# Patient Record
Sex: Female | Born: 1990 | Hispanic: No | Marital: Single | Smoking: Former smoker
Health system: Southern US, Community
[De-identification: ages and names within clinical notes are randomized; demographics above are authoritative.]

## PROBLEM LIST (undated history)

## (undated) DIAGNOSIS — I10 Essential (primary) hypertension: Secondary | ICD-10-CM

## (undated) HISTORY — PX: NO PAST SURGERIES: SHX2092

## (undated) HISTORY — DX: Essential (primary) hypertension: I10

---

## 2014-07-03 ENCOUNTER — Emergency Department (HOSPITAL_COMMUNITY)
Admission: EM | Admit: 2014-07-03 | Discharge: 2014-07-03 | Disposition: A | Discharge status: Home / Self Care | Attending: Emergency Medicine | Admitting: Emergency Medicine

## 2014-07-03 ENCOUNTER — Encounter (HOSPITAL_COMMUNITY): Payer: Self-pay | Admitting: Emergency Medicine

## 2014-07-03 ENCOUNTER — Emergency Department (HOSPITAL_COMMUNITY)

## 2014-07-03 DIAGNOSIS — Y9389 Activity, other specified: Secondary | ICD-10-CM | POA: Insufficient documentation

## 2014-07-03 DIAGNOSIS — W010XXA Fall on same level from slipping, tripping and stumbling without subsequent striking against object, initial encounter: Secondary | ICD-10-CM | POA: Diagnosis not present

## 2014-07-03 DIAGNOSIS — S99919A Unspecified injury of unspecified ankle, initial encounter: Secondary | ICD-10-CM | POA: Diagnosis present

## 2014-07-03 DIAGNOSIS — S99929A Unspecified injury of unspecified foot, initial encounter: Principal | ICD-10-CM

## 2014-07-03 DIAGNOSIS — F10920 Alcohol use, unspecified with intoxication, uncomplicated: Secondary | ICD-10-CM

## 2014-07-03 DIAGNOSIS — Y9289 Other specified places as the place of occurrence of the external cause: Secondary | ICD-10-CM | POA: Insufficient documentation

## 2014-07-03 DIAGNOSIS — F1010 Alcohol abuse, uncomplicated: Secondary | ICD-10-CM | POA: Diagnosis not present

## 2014-07-03 DIAGNOSIS — S8990XA Unspecified injury of unspecified lower leg, initial encounter: Secondary | ICD-10-CM | POA: Diagnosis not present

## 2014-07-03 DIAGNOSIS — W1809XA Striking against other object with subsequent fall, initial encounter: Secondary | ICD-10-CM | POA: Insufficient documentation

## 2014-07-03 DIAGNOSIS — S8991XA Unspecified injury of right lower leg, initial encounter: Secondary | ICD-10-CM

## 2014-07-03 MED ORDER — IBUPROFEN 800 MG PO TABS
800.0000 mg | ORAL_TABLET | Freq: Once | ORAL | Status: AC
  Administered 2014-07-03: 800 mg via ORAL
  Filled 2014-07-03: qty 1

## 2014-07-03 NOTE — ED Notes (Signed)
Pt verbalizes that she is leaving with ALL belongings she arrived with. PT is being d/c charged with crutches in hand and dc papers and friend driving.

## 2014-07-03 NOTE — Discharge Instructions (Signed)
Take motrin 600 mg every 6 hrs for pain.   Use crutches as needed.   Follow up with your doctor.   Return to ER if you have severe pain, unable to walk.

## 2014-07-03 NOTE — ED Notes (Signed)
Pt states she was at the pool earlier today, drinking, and slipped and fell, hitting her R knee. Now c/o R knee pain. Alert and oriented.

## 2014-07-03 NOTE — ED Provider Notes (Signed)
CSN: [id number]     Arrival date & time 07/03/14  0229 History   First MD Initiated Contact with Patient 07/03/14 [phone]     Chief Complaint  Patient presents with  . Knee Pain     (Consider location/radiation/quality/duration/timing/severity/associated sxs/prior Treatment) The history is provided by the patient.  Brittany Long is a 23 y.o. female here with R knee pain. Was playing at the pool and drinking alcohol and accidentally slipped and fell and hit R knee. Denies other injuries.    History reviewed. No pertinent past medical history. History reviewed. No pertinent past surgical history. History reviewed. No pertinent family history. History  Substance Use Topics  . Smoking status: Not on file  . Smokeless tobacco: Not on file  . Alcohol Use: Yes   OB History   Grav Para Term Preterm Abortions TAB SAB Ect Mult Living                 Review of Systems  Musculoskeletal:       R knee pain   All other systems reviewed and are negative.     Allergies  Review of patient's allergies indicates no known allergies.  Home Medications   Prior to Admission medications   Medication Sig Start Date End Date Taking? Authorizing Provider  acetaminophen (TYLENOL) 500 MG tablet Take 1,000 mg by mouth every 6 (six) hours as needed for mild pain.   Yes Historical Provider, MD   BP 117/81  Pulse 92  Temp(Src) 99.1 F (37.3 C) (Oral)  SpO2 98% Physical Exam  Nursing note and vitals reviewed. Constitutional: She is oriented to person, place, and time.  Slightly intoxicated   HENT:  Head: Normocephalic and atraumatic.  Eyes: Pupils are equal, round, and reactive to light.  Cardiovascular: Normal rate.   Pulmonary/Chest: Effort normal.  Abdominal: Soft.  Musculoskeletal:  R knee not swollen, nl ROM. Minimal tenderness on knee cap. ACL, PCL intact   Neurological: She is alert and oriented to person, place, and time.  Skin: Skin is warm and dry.  Psychiatric: She has a normal  mood and affect. Her behavior is normal. Judgment normal.    ED Course  Procedures (including critical care time) Labs Review Labs Reviewed - No data to display  Imaging Review Dg Knee Complete 4 Views Right  07/03/2014   CLINICAL DATA:  Fall September 15th, patellar pain.  EXAM: RIGHT KNEE - COMPLETE 4+ VIEW  COMPARISON:  None.  FINDINGS: There is no evidence of fracture, dislocation, or joint effusion. There is no evidence of arthropathy or other focal bone abnormality. Soft tissues are unremarkable.  IMPRESSION: Negative.   Electronically Signed   By: Awilda Metro   On: 07/03/2014 03:42     EKG Interpretation None      MDM   Final diagnoses:  None   Brittany Long is a 23 y.o. female here with R knee pain after fall. Xray showed no obvious fracture. Neurovascular intact. Will give crutches. Patient will be d/c home with friend, who is sober.    Richardean Canal, MD 07/03/14 [phone]

## 2014-09-04 DIAGNOSIS — E559 Vitamin D deficiency, unspecified: Secondary | ICD-10-CM | POA: Insufficient documentation

## 2014-09-16 ENCOUNTER — Ambulatory Visit (INDEPENDENT_AMBULATORY_CARE_PROVIDER_SITE_OTHER): Admitting: Physician Assistant

## 2014-09-16 VITALS — BP 120/76 | HR 81 | Temp 98.9°F | Resp 18 | Ht 64.0 in | Wt 145.8 lb

## 2014-09-16 DIAGNOSIS — R300 Dysuria: Secondary | ICD-10-CM

## 2014-09-16 DIAGNOSIS — Z113 Encounter for screening for infections with a predominantly sexual mode of transmission: Secondary | ICD-10-CM

## 2014-09-16 LAB — POCT UA - MICROSCOPIC ONLY
CRYSTALS, UR, HPF, POC: NEGATIVE
EPITHELIAL CELLS, URINE PER MICROSCOPY: NEGATIVE
MUCUS UA: NEGATIVE
Yeast, UA: NEGATIVE

## 2014-09-16 LAB — POCT URINALYSIS DIPSTICK
Bilirubin, UA: NEGATIVE
Glucose, UA: NEGATIVE
Ketones, UA: NEGATIVE
Nitrite, UA: NEGATIVE
PROTEIN UA: 30
SPEC GRAV UA: 1.025
UROBILINOGEN UA: 0.2
pH, UA: 6

## 2014-09-16 MED ORDER — CIPROFLOXACIN HCL 500 MG PO TABS: 500.0000 mg | ORAL_TABLET | Freq: Two times a day (BID) | ORAL | Status: DC

## 2014-09-16 MED ORDER — PHENAZOPYRIDINE HCL 200 MG PO TABS
200.0000 mg | ORAL_TABLET | Freq: Three times a day (TID) | ORAL | Status: DC | PRN
Start: 2014-09-16 — End: 2017-10-06

## 2014-09-16 NOTE — Progress Notes (Signed)
Subjective:    Patient ID: Brittany Long, female    DOB: 11/02/1990, 23 y.o.   MRN: [id number]   PCP: No PCP Per Patient  Chief Complaint  Patient presents with  . Hematuria    Noticed this morning. Very painful to urinate. Noticed blood in her urine.   . Abdominal Pain    x1 day    No Known Allergies  Patient Active Problem List   Diagnosis Date Noted  . Avitaminosis D 09/04/2014    Prior to Admission medications   Medication Sig Start Date End Date Taking? Authorizing Provider  acetaminophen (TYLENOL) 500 MG tablet Take 1,000 mg by mouth every 6 (six) hours as needed for mild pain.   Yes Historical Provider, MD  Norgestimate-Ethinyl Estradiol Triphasic (TRI-SPRINTEC) 0.18/0.215/0.25 MG-35 MCG tablet Take 1 tablet by mouth daily.   Yes Historical Provider, MD  Vitamin D, Ergocalciferol, (DRISDOL) 50000 UNITS CAPS capsule Take 50,000 Units by mouth every 7 (seven) days.   Yes Historical Provider, MD    Medical, Surgical, Family and Social History reviewed and updated.  HPI  This 23 y.o. female presents for evaluation of burning with urination, urgency and frequency that began this morning. She has low pelvic pain, but no back pain. She noticed blood in her urine today, but also notes that she's on her period. No fever, chills, nausea, vomiting or diarrhea. No HA or dizziness. No atypical vaginal discharge.   Sexually active with 1 female partner, new, last weekend. Did not use condoms, though she typically does.   Review of Systems As above.    Objective:   Physical Exam  Constitutional: She is oriented to person, place, and time. Vital signs are normal. She appears well-developed and well-nourished. No distress.  BP 120/76 mmHg  Pulse 81  Temp(Src) 98.9 F (37.2 C) (Oral)  Resp 18  Ht 5\' 4"  (1.626 m)  Wt 145 lb 12.8 oz (66.134 kg)  BMI 25.01 kg/m2  SpO2 98%  LMP 09/10/2014   HENT:  Head: Normocephalic and atraumatic.  Cardiovascular: Normal rate, regular rhythm  and normal heart sounds.   Pulmonary/Chest: Effort normal and breath sounds normal.  Abdominal: Soft. Normal appearance and bowel sounds are normal. She exhibits no distension and no mass. There is no hepatosplenomegaly. There is tenderness ("makes me feel like I need to pee") in the suprapubic area. There is no rigidity, no rebound, no guarding, no CVA tenderness, no tenderness at McBurney's point and negative Murphy's sign. No hernia.  Musculoskeletal: Normal range of motion.       Lumbar back: Normal.  Neurological: She is alert and oriented to person, place, and time.  Skin: Skin is warm and dry. No rash noted. She is not diaphoretic. No pallor.  Psychiatric: She has a normal mood and affect. Her speech is normal and behavior is normal. Judgment normal.      Results for orders placed or performed in visit on 09/16/14  POCT UA - Microscopic Only  Result Value Ref Range   WBC, Ur, HPF, POC tntc    RBC, urine, microscopic tntc    Bacteria, U Microscopic 4+    Mucus, UA neg    Epithelial cells, urine per micros neg    Crystals, Ur, HPF, POC neg    Casts, Ur, LPF, POC renal tubular    Yeast, UA neg   POCT urinalysis dipstick  Result Value Ref Range   Color, UA Tippens    Clarity, UA cloudy    Glucose, UA  neg    Bilirubin, UA neg    Ketones, UA neg    Spec Grav, UA 1.025    Blood, UA large    pH, UA 6.0    Protein, UA 30    Urobilinogen, UA 0.2    Nitrite, UA neg    Leukocytes, UA moderate (2+)        Assessment & Plan:  1. Dysuria Likely UTI. Await UCx. Anticipatory guidance. Supportive care. RTC if symptoms worsen/persist. Recheck urine in 2 weeks to check for resolution of hematuria. - POCT UA - Microscopic Only - POCT urinalysis dipstick - GC/Chlamydia Probe Amp - Urine culture - ciprofloxacin (CIPRO) 500 MG tablet; Take 1 tablet (500 mg total) by mouth 2 (two) times daily.  Dispense: 10 tablet; Refill: 0 - phenazopyridine (PYRIDIUM) 200 MG tablet; Take 1 tablet (200  mg total) by mouth 3 (three) times daily as needed for pain.  Dispense: 10 tablet; Refill: 0  2. Routine screening for STI (sexually transmitted infection) Encouraged consistent condom use. Await labs. - Hepatitis B surface antigen - Hepatitis C antibody - HIV antibody - HSV(herpes simplex vrs) 1+2 ab-IgG - RPR - Hepatitis B surface antibody   Fernande Brashelle S. Ariona Deschene, PA-C Physician Assistant-Certified [facility]

## 2014-09-16 NOTE — Patient Instructions (Signed)

## 2014-09-17 LAB — HIV ANTIBODY (ROUTINE TESTING W REFLEX): HIV 1&2 Ab, 4th Generation: NONREACTIVE

## 2014-09-17 LAB — HSV(HERPES SIMPLEX VRS) I + II AB-IGG
HSV 1 Glycoprotein G Ab, IgG: 3.36 IV — ABNORMAL HIGH
HSV 2 Glycoprotein G Ab, IgG: <0.1 IV

## 2014-09-17 LAB — GC/CHLAMYDIA PROBE AMP
CT Probe RNA: NEGATIVE
GC Probe RNA: NEGATIVE

## 2014-09-17 LAB — HEPATITIS B SURFACE ANTIGEN: Hepatitis B Surface Ag: NEGATIVE

## 2014-09-17 LAB — SYPHILIS: RPR W/REFLEX TO RPR TITER AND TREPONEMAL ANTIBODIES, TRADITIONAL SCREENING AND DIAGNOSIS ALGORITHM

## 2014-09-17 LAB — HEPATITIS C ANTIBODY: HCV Ab: NEGATIVE

## 2014-09-17 LAB — HEPATITIS B SURFACE ANTIBODY, QUANTITATIVE: Hep B S AB Quant (Post): 2.6 m[IU]/mL

## 2014-09-18 LAB — URINE CULTURE: Colony Count: >=100000

## 2014-09-23 MED ORDER — FLUCONAZOLE 150 MG PO TABS: 150.0000 mg | ORAL_TABLET | Freq: Once | ORAL | Status: DC

## 2014-09-23 MED ORDER — SULFAMETHOXAZOLE-TRIMETHOPRIM 800-160 MG PO TABS: 1.0000 | ORAL_TABLET | Freq: Two times a day (BID) | ORAL | Status: AC

## 2014-09-23 NOTE — Addendum Note (Signed)
Addended by: Fernande BrasJEFFERY, Mattew Chriswell S on: 09/23/2014 12:32 PM   Modules accepted: Orders, Medications

## 2014-09-23 NOTE — Addendum Note (Signed)
Addended by: Fernande BrasJEFFERY, Eesha Schmaltz S on: 09/23/2014 07:30 PM   Modules accepted: Orders

## 2014-09-26 ENCOUNTER — Other Ambulatory Visit: Payer: Self-pay | Admitting: Oral Surgery

## 2014-09-26 DIAGNOSIS — K08409 Partial loss of teeth, unspecified cause, unspecified class: Secondary | ICD-10-CM

## 2017-07-28 ENCOUNTER — Emergency Department (HOSPITAL_COMMUNITY)
Admission: EM | Admit: 2017-07-28 | Discharge: 2017-07-28 | Disposition: A | Discharge status: Home / Self Care | Payer: Medicaid Other | Attending: Emergency Medicine | Admitting: Emergency Medicine

## 2017-07-28 ENCOUNTER — Encounter (HOSPITAL_COMMUNITY): Payer: Self-pay | Admitting: *Deleted

## 2017-07-28 DIAGNOSIS — O4690 Antepartum hemorrhage, unspecified, unspecified trimester: Secondary | ICD-10-CM

## 2017-07-28 DIAGNOSIS — B9689 Other specified bacterial agents as the cause of diseases classified elsewhere: Secondary | ICD-10-CM | POA: Diagnosis not present

## 2017-07-28 DIAGNOSIS — O4692 Antepartum hemorrhage, unspecified, second trimester: Secondary | ICD-10-CM | POA: Diagnosis present

## 2017-07-28 DIAGNOSIS — F17290 Nicotine dependence, other tobacco product, uncomplicated: Secondary | ICD-10-CM | POA: Insufficient documentation

## 2017-07-28 DIAGNOSIS — Z2913 Encounter for prophylactic Rho(D) immune globulin: Secondary | ICD-10-CM | POA: Diagnosis not present

## 2017-07-28 DIAGNOSIS — Z3A14 14 weeks gestation of pregnancy: Secondary | ICD-10-CM | POA: Insufficient documentation

## 2017-07-28 DIAGNOSIS — O99332 Smoking (tobacco) complicating pregnancy, second trimester: Secondary | ICD-10-CM | POA: Insufficient documentation

## 2017-07-28 DIAGNOSIS — O23592 Infection of other part of genital tract in pregnancy, second trimester: Secondary | ICD-10-CM | POA: Insufficient documentation

## 2017-07-28 DIAGNOSIS — N898 Other specified noninflammatory disorders of vagina: Secondary | ICD-10-CM | POA: Insufficient documentation

## 2017-07-28 DIAGNOSIS — R1031 Right lower quadrant pain: Secondary | ICD-10-CM | POA: Insufficient documentation

## 2017-07-28 DIAGNOSIS — N760 Acute vaginitis: Secondary | ICD-10-CM

## 2017-07-28 LAB — URINALYSIS, ROUTINE W REFLEX MICROSCOPIC
Bilirubin Urine: NEGATIVE
Glucose, UA: NEGATIVE mg/dL
Ketones, ur: NEGATIVE mg/dL
Leukocytes, UA: NEGATIVE
Nitrite: NEGATIVE
Protein, ur: NEGATIVE mg/dL
Specific Gravity, Urine: 1.015 (ref 1.005–1.030)
pH: 6 (ref 5.0–8.0)

## 2017-07-28 LAB — CBC WITH DIFFERENTIAL/PLATELET
Basophils Absolute: 0 10*3/uL (ref 0.0–0.1)
Basophils Relative: 0 %
Eosinophils Absolute: 0.2 10*3/uL (ref 0.0–0.7)
Eosinophils Relative: 2 %
HCT: 39.1 % (ref 36.0–46.0)
Hemoglobin: 13.8 g/dL (ref 12.0–15.0)
Lymphocytes Relative: 26 %
Lymphs Abs: 2.7 10*3/uL (ref 0.7–4.0)
MCH: 29.7 pg (ref 26.0–34.0)
MCHC: 35.3 g/dL (ref 30.0–36.0)
MCV: 84.1 fL (ref 78.0–100.0)
Monocytes Absolute: 0.7 10*3/uL (ref 0.1–1.0)
Monocytes Relative: 7 %
Neutro Abs: 6.7 10*3/uL (ref 1.7–7.7)
Neutrophils Relative %: 65 %
Platelets: 195 10*3/uL (ref 150–400)
RBC: 4.65 MIL/uL (ref 3.87–5.11)
RDW: 12.1 % (ref 11.5–15.5)
WBC: 10.2 10*3/uL (ref 4.0–10.5)

## 2017-07-28 LAB — BASIC METABOLIC PANEL
Anion gap: 8 (ref 5–15)
BUN: 7 mg/dL (ref 6–20)
CO2: 25 mmol/L (ref 22–32)
Calcium: 9 mg/dL (ref 8.9–10.3)
Chloride: 102 mmol/L (ref 101–111)
Creatinine, Ser: 0.55 mg/dL (ref 0.44–1.00)
GFR calc Af Amer: >60 mL/min (ref >60)
GFR calc non Af Amer: >60 mL/min (ref >60)
Glucose, Bld: 94 mg/dL (ref 65–99)
Potassium: 3.8 mmol/L (ref 3.5–5.1)
Sodium: 135 mmol/L (ref 135–145)

## 2017-07-28 LAB — URINALYSIS, MICROSCOPIC (REFLEX): WBC, UA: NONE SEEN WBC/hpf (ref 0–5)

## 2017-07-28 LAB — WET PREP, GENITAL
Sperm: NONE SEEN
Trich, Wet Prep: NONE SEEN
Yeast Wet Prep HPF POC: NONE SEEN

## 2017-07-28 LAB — ABO/RH
ABO/RH(D): O NEG
Antibody Screen: NEGATIVE

## 2017-07-28 LAB — HCG, QUANTITATIVE, PREGNANCY: hCG, Beta Chain, Quant, S: 63541 m[IU]/mL — ABNORMAL HIGH (ref <5)

## 2017-07-28 MED ORDER — RHO D IMMUNE GLOBULIN 1500 UNIT/2ML IJ SOSY
300.0000 ug | PREFILLED_SYRINGE | Freq: Once | INTRAMUSCULAR | Status: AC
  Administered 2017-07-28: 300 ug via INTRAMUSCULAR

## 2017-07-28 MED ORDER — METRONIDAZOLE 500 MG PO TABS: 500.0000 mg | ORAL_TABLET | Freq: Two times a day (BID) | ORAL | 0 refills | Status: DC

## 2017-07-28 NOTE — ED Notes (Signed)
Pelvic Cart at bedside 

## 2017-07-28 NOTE — ED Provider Notes (Signed)
[facility]EMERGENCY DEPT Provider Note   CSN: [id number] Arrival date & time: 07/28/17  4098     History   Chief Complaint Chief Complaint  Patient presents with  . Vaginal Bleeding    HPI Brittany Long is a 26 y.o. female.  HPI   26 year old female presents today with complaints of vaginal bleeding.  Patient reports she is 14 weeks by LMP of April 21, 2017.  Patient notes that she has been seen by OB/GYN with confirmed intrauterine pregnancy with no concerning findings.  She notes at that time she was having right lower quadrant abdominal pain, they report this is normal in pregnancy.  Patient notes again she had the abdominal pain today, also having small amount of red vaginal bleeding without discharge.  Patient denies any nausea vomiting, infectious etiology.  She reports that she is not sexually active.  No pain currently.     History reviewed. No pertinent past medical history.  Patient Active Problem List   Diagnosis Date Noted  . Avitaminosis D 09/04/2014    History reviewed. No pertinent surgical history.  OB History    Gravida Para Term Preterm AB Living   1             SAB TAB Ectopic Multiple Live Births                   Home Medications    Prior to Admission medications   Medication Sig Start Date End Date Taking? Authorizing Provider  acetaminophen (TYLENOL) 500 MG tablet Take 1,000 mg by mouth every 6 (six) hours as needed for mild pain.    [provider]  fluconazole (DIFLUCAN) 150 MG tablet Take 1 tablet (150 mg total) by mouth once. Repeat if needed 09/23/14   Porfirio Oar, PA-C  metroNIDAZOLE (FLAGYL) 500 MG tablet Take 1 tablet (500 mg total) by mouth 2 (two) times daily. 07/28/17   Makalya Nave, Tinnie Gens, PA-C  Norgestimate-Ethinyl Estradiol Triphasic (TRI-SPRINTEC) 0.18/0.215/0.25 MG-35 MCG tablet Take 1 tablet by mouth daily.    [provider]  phenazopyridine (PYRIDIUM) 200 MG tablet Take 1 tablet (200 mg total) by mouth 3 (three)  times daily as needed for pain. 09/16/14   Porfirio Oar, PA-C  Vitamin D, Ergocalciferol, (DRISDOL) 50000 UNITS CAPS capsule Take 50,000 Units by mouth every 7 (seven) days.    [provider]    Family History No family history on file.  Social History Social History  Substance Use Topics  . Smoking status: Current Every Day Smoker    Years: 1.00    Types: Cigars  . Smokeless tobacco: Never Used     Comment: 1 black & mild daily  . Alcohol use 0.0 oz/week     Comment: every now and then, on the weekend     Allergies   Patient has no known allergies.   Review of Systems Review of Systems  All other systems reviewed and are negative.    Physical Exam Updated Vital Signs BP 120/80 (BP Location: Right Arm)   Pulse 70   Temp 98.6 F (37 C) (Oral)   Resp 16   LMP 04/21/2017   SpO2 100%   Physical Exam  Constitutional: She is oriented to person, place, and time. She appears well-developed and well-nourished.  HENT:  Head: Normocephalic and atraumatic.  Eyes: Pupils are equal, round, and reactive to light. Conjunctivae are normal. Right eye exhibits no discharge. Left eye exhibits no discharge. No scleral icterus.  Neck: Normal range of motion.  No JVD present. No tracheal deviation present.  Pulmonary/Chest: Effort normal. No stridor.  Abdominal: Soft. She exhibits no distension and no mass. There is no tenderness. There is no rebound and no guarding. No hernia.  Genitourinary:  Genitourinary Comments: Small amount of red blood in the vaginal vault, cervical loss is closed, no cervical motion tenderness or masses  Neurological: She is alert and oriented to person, place, and time. Coordination normal.  Skin: Skin is warm.  Psychiatric: She has a normal mood and affect. Her behavior is normal. Judgment and thought content normal.  Nursing note and vitals reviewed.    ED Treatments / Results  Labs (all labs ordered are listed, but only abnormal results  are displayed) Labs Reviewed  WET PREP, GENITAL - Abnormal; Notable for the following:       Result Value   Clue Cells Wet Prep HPF POC PRESENT (*)    WBC, Wet Prep HPF POC MODERATE (*)    All other components within normal limits  HCG, QUANTITATIVE, PREGNANCY - Abnormal; Notable for the following:    hCG, Beta Chain, Quant, S 63,541 (*)    All other components within normal limits  URINALYSIS, ROUTINE W REFLEX MICROSCOPIC - Abnormal; Notable for the following:    Hgb urine dipstick MODERATE (*)    All other components within normal limits  URINALYSIS, MICROSCOPIC (REFLEX) - Abnormal; Notable for the following:    Bacteria, UA RARE (*)    Squamous Epithelial / LPF 6-30 (*)    All other components within normal limits  CBC WITH DIFFERENTIAL/PLATELET  BASIC METABOLIC PANEL  ABO/RH  RH IG WORKUP (INCLUDES ABO/RH)  GC/CHLAMYDIA PROBE AMP ([place]) NOT AT [facility]    EKG  EKG Interpretation None       Radiology No results found.  Procedures Procedures (including critical care time)  Medications Ordered in ED Medications  rho (d) immune globulin (RHIG/RHOPHYLAC) injection 300 mcg (300 mcg Intramuscular Given 07/28/17 1507)     Initial Impression / Assessment and Plan / ED Course  I have reviewed the triage vital signs and the nursing notes.  Pertinent labs & imaging results that were available during my care of the patient were reviewed by me and considered in my medical decision making (see chart for details).     Final Clinical Impressions(s) / ED Diagnoses   Final diagnoses:  Vaginal bleeding in pregnancy  BV (bacterial vaginosis)    Labs: Wet prep, hCG, CBC, BMP, ABO Rh, gonorrhea chlamydia  Imaging:  Consults:  Therapeutics: RhoGam  Discharge Meds:   Assessment/Plan: 26 year old female presents today with vaginal bleeding.  Patient reports normal bleeding at the time of evaluation.  She does have small amount of blood in her vaginal vault.  Her cervix  is closed, no tenderness.  She has reassuring fetal heart sounds at 148 with no abdominal pain.  Patient Brittany Long has confirmed intrauterine pregnancy, very low suspicion for ectopic, significant bleed, or fetal demise given reassuring heart sounds.  Patient does have clue cells on her wet prep, given the fact that she is having vaginal bleeding this could likely be secondary to PVD, she will be treated for metronidazole, encouraged her to follow-up with OB for discussion.  Patient oh negative here, she is a RhoGam candidate given the vaginal bleeding.  Patient will be discharged home with strict return precautions.  Patient verbalized understanding and agreement to today's plan had no further questions or concerns at the time discharge.   New Prescriptions Discharge  Medication List as of 07/28/2017  3:53 PM    START taking these medications   Details  metroNIDAZOLE (FLAGYL) 500 MG tablet Take 1 tablet (500 mg total) by mouth 2 (two) times daily., Starting Thu 07/28/2017, Print         Brittany Long, [place], PA-C 07/28/17 1553    Pricilla Loveless, MD 07/29/17 [phone]

## 2017-07-28 NOTE — ED Triage Notes (Signed)
Pt developed right lower back and lower abdominal pain last night that was it was sharp and now just an ache.  She went to restroom and wiped and saw blood was bright red on the paper. Pt states she is [redacted] weeks pregnant. G-2, P- 0, M-1

## 2017-07-28 NOTE — Discharge Instructions (Signed)
Please read attached information. If you experience any new or worsening signs or symptoms please return to the emergency room for evaluation. Please follow-up with your primary care provider or specialist as discussed. Please use medication prescribed only as directed and discontinue taking if you have any concerning signs or symptoms.   °

## 2017-07-28 NOTE — ED Notes (Addendum)
Doppler at bedside for fetal heart tones. EDP assessing.

## 2017-07-29 LAB — RH IG WORKUP (INCLUDES ABO/RH)
ABO/RH(D): O NEG
ANTIBODY SCREEN: NEGATIVE
Gestational Age(Wks): 14
UNIT DIVISION: 0

## 2017-07-29 LAB — GC/CHLAMYDIA PROBE AMP (~~LOC~~) NOT AT ARMC
Chlamydia: NEGATIVE
Neisseria Gonorrhea: NEGATIVE

## 2017-08-04 ENCOUNTER — Other Ambulatory Visit: Payer: Self-pay | Admitting: Nurse Practitioner

## 2017-08-04 DIAGNOSIS — Z36 Encounter for antenatal screening for chromosomal anomalies: Secondary | ICD-10-CM

## 2017-08-04 LAB — OB RESULTS CONSOLE ANTIBODY SCREEN: Antibody Screen: POSITIVE

## 2017-08-04 LAB — OB RESULTS CONSOLE RPR: RPR: NONREACTIVE

## 2017-08-04 LAB — OB RESULTS CONSOLE PLATELET COUNT: PLATELETS: 187

## 2017-08-04 LAB — GLUCOSE, 3 HOUR

## 2017-08-04 LAB — OB RESULTS CONSOLE VARICELLA ZOSTER ANTIBODY, IGG: VARICELLA IGG: IMMUNE

## 2017-08-04 LAB — CULTURE, OB URINE: Urine Culture, OB: NEGATIVE

## 2017-08-04 LAB — OB RESULTS CONSOLE ABO/RH: RH Type: NEGATIVE

## 2017-08-04 LAB — SICKLE CELL SCREEN: Sickle Cell Screen: NORMAL

## 2017-08-04 LAB — OB RESULTS CONSOLE HGB/HCT, BLOOD
HEMATOCRIT: 43
Hemoglobin: 14.2

## 2017-08-04 LAB — OB RESULTS CONSOLE GC/CHLAMYDIA
CHLAMYDIA, DNA PROBE: NEGATIVE
GC PROBE AMP, GENITAL: NEGATIVE

## 2017-08-04 LAB — OB RESULTS CONSOLE HIV ANTIBODY (ROUTINE TESTING): HIV: NONREACTIVE

## 2017-08-04 LAB — GLUCOSE, 1 HOUR: Glucose, 1 hour: 176

## 2017-08-04 LAB — CYTOLOGY - PAP: PAP SMEAR: NEGATIVE

## 2017-08-04 LAB — OB RESULTS CONSOLE HEPATITIS B SURFACE ANTIGEN: Hepatitis B Surface Ag: NEGATIVE

## 2017-08-04 LAB — OB RESULTS CONSOLE RUBELLA ANTIBODY, IGM: RUBELLA: IMMUNE

## 2017-08-04 LAB — CYSTIC FIBROSIS DIAGNOSTIC STUDY: INTERPRETATION-CFDNA: NEGATIVE

## 2017-08-24 ENCOUNTER — Ambulatory Visit (HOSPITAL_COMMUNITY): Payer: Medicaid Other

## 2017-08-24 ENCOUNTER — Encounter (HOSPITAL_COMMUNITY): Payer: Medicaid Other

## 2017-08-25 ENCOUNTER — Encounter (HOSPITAL_COMMUNITY): Payer: Self-pay | Admitting: Nurse Practitioner

## 2017-08-31 ENCOUNTER — Encounter (HOSPITAL_COMMUNITY): Payer: Self-pay | Admitting: *Deleted

## 2017-09-02 ENCOUNTER — Encounter (HOSPITAL_COMMUNITY): Payer: Self-pay

## 2017-09-02 ENCOUNTER — Ambulatory Visit (HOSPITAL_COMMUNITY)
Admission: RE | Admit: 2017-09-02 | Discharge: 2017-09-02 | Disposition: A | Discharge status: Home / Self Care | Payer: Medicaid Other | Source: Ambulatory Visit | Attending: Nurse Practitioner | Admitting: Nurse Practitioner

## 2017-09-02 ENCOUNTER — Other Ambulatory Visit: Payer: Self-pay | Admitting: Nurse Practitioner

## 2017-09-02 DIAGNOSIS — O352XX0 Maternal care for (suspected) hereditary disease in fetus, not applicable or unspecified: Secondary | ICD-10-CM

## 2017-09-02 DIAGNOSIS — Z3A19 19 weeks gestation of pregnancy: Secondary | ICD-10-CM

## 2017-09-02 DIAGNOSIS — F840 Autistic disorder: Secondary | ICD-10-CM

## 2017-09-02 DIAGNOSIS — Z818 Family history of other mental and behavioral disorders: Secondary | ICD-10-CM | POA: Insufficient documentation

## 2017-09-02 DIAGNOSIS — Z36 Encounter for antenatal screening for chromosomal anomalies: Secondary | ICD-10-CM | POA: Diagnosis present

## 2017-09-02 NOTE — Progress Notes (Signed)
Genetic Counseling  Visit Summary Note  Appointment Date: 09/02/2017 Referred By: Brittany Sorrel, NP  Date of Birth: [birth year]  Pregnancy history: G2P0010 Estimated Date of Delivery: 01/26/18 Estimated Gestational Age: [redacted]w[redacted]d I met with [name] her partner, Brittany Long for genetic counseling because of a family history of autism.  In summary:  Discussed family history of autism  Reviewed multifactorial etiologies for autism  Discussed options of screening / testing  Carrier screening for fragile X syndrome today  Discussed general population carrier screening options - drawn today  CF  SMA  Hemoglobinopathies  We began by reviewing the family history in detail. Ms. BCannadyreported that she has a maternal half brother who has a diagnosis of autism.  He is verbal, has behavioral differences in terms of social skills, eye contact and repetition, and does not read or do math well.  He cannot read a menu or drive a car.  He has no major medical concerns.  Ms. BMccrumbbelieves this is from her brother's father's family and not from their mother's family.  We discussed that fragile X syndrome is a common explanation for autism and intellectual disabilities in males.  While there is no other family history of individuals with differences similar to her brothers in her mother's family, we discussed that it does not rule out fragile X syndrome in her family.  We reviewed the X linked recessive inheritance of fragile X syndrome and the 50% chance for an affected female when women are known carriers.  We discussed the option of carrier screening for fragile X syndrome, including the risks, benefits and limitations of that screening.  We discussed that some female carriers of fragile X syndrome will develop premature ovarian failure and/or fragile X associated tremor and ataxia syndrome.  Thus, there may be implications beyond just those of reproduction.    The family histories were  otherwise found to be noncontributory for birth defects, mental retardation, and known genetic conditions. Without further information regarding the provided family history, an accurate genetic risk cannot be calculated. Further genetic counseling is warranted if more information is obtained.  After reviewing these options, [name] to have screening for Fragile X syndrome.  [name] provided with written information regarding cystic fibrosis (CF), spinal muscular atrophy (SMA) and hemoglobinopathies including the carrier frequency, availability of carrier screening and prenatal diagnosis if indicated.  In addition, we discussed that CF and hemoglobinopathies are routinely screened for as part of the [place] newborn screening panel.  After further discussion, she elected to pursue screening for CF, SMA and hemoglobinopathies also.  [name] exposure to environmental toxins or chemical agents. She denied the use of alcohol, tobacco or street drugs. She denied significant viral illnesses during the course of her pregnancy. Her medical and surgical histories were noncontributory.   I counseled this couple regarding the above risks and available options.  The approximate face-to-face time with the genetic counselor was 35 minutes.  DCam Hai MS Certified Genetic Counselor

## 2017-09-22 ENCOUNTER — Other Ambulatory Visit: Payer: Self-pay

## 2017-10-04 ENCOUNTER — Encounter: Payer: Self-pay | Admitting: *Deleted

## 2017-10-06 ENCOUNTER — Encounter: Payer: Self-pay | Admitting: Obstetrics and Gynecology

## 2017-10-06 ENCOUNTER — Other Ambulatory Visit: Payer: Self-pay

## 2017-10-06 ENCOUNTER — Ambulatory Visit (INDEPENDENT_AMBULATORY_CARE_PROVIDER_SITE_OTHER): Payer: Medicaid Other | Admitting: Obstetrics and Gynecology

## 2017-10-06 VITALS — BP 132/68 | HR 91 | Wt 166.9 lb

## 2017-10-06 DIAGNOSIS — O10912 Unspecified pre-existing hypertension complicating pregnancy, second trimester: Secondary | ICD-10-CM

## 2017-10-06 DIAGNOSIS — O0990 Supervision of high risk pregnancy, unspecified, unspecified trimester: Secondary | ICD-10-CM | POA: Insufficient documentation

## 2017-10-06 DIAGNOSIS — O0992 Supervision of high risk pregnancy, unspecified, second trimester: Secondary | ICD-10-CM

## 2017-10-06 DIAGNOSIS — O10919 Unspecified pre-existing hypertension complicating pregnancy, unspecified trimester: Secondary | ICD-10-CM | POA: Insufficient documentation

## 2017-10-06 LAB — POCT URINALYSIS DIP (DEVICE)
Bilirubin Urine: NEGATIVE
GLUCOSE, UA: NEGATIVE mg/dL
Hgb urine dipstick: NEGATIVE
Leukocytes, UA: NEGATIVE
Nitrite: NEGATIVE
PH: 7 (ref 5.0–8.0)
PROTEIN: NEGATIVE mg/dL
Specific Gravity, Urine: 1.02 (ref 1.005–1.030)
UROBILINOGEN UA: 0.2 mg/dL (ref 0.0–1.0)

## 2017-10-06 LAB — GLUCOSE, 3 HOUR: Glucose 3 Hour: 91

## 2017-10-06 MED ORDER — COMFORT FIT MATERNITY SUPP MED MISC: 0 refills | Status: DC

## 2017-10-06 MED ORDER — ASPIRIN EC 81 MG PO TBEC: 81.0000 mg | DELAYED_RELEASE_TABLET | Freq: Every day | ORAL | 2 refills | Status: DC

## 2017-10-06 NOTE — BH Specialist Note (Unsigned)
Integrated Behavioral Health Initial Visit  MRN: [id number] Name: Brittany Long  Number of Integrated Behavioral Health Clinician visits:: 1/6 Session Start time: ***  Session End time: *** Total time: {IBH Total Time:21014050}  Type of Service: Integrated Behavioral Health- Individual/Family Interpretor:No. Interpretor Name and Language: n/a   Warm Hand Off Completed.       SUBJECTIVE: Brittany Long is a 26 y.o. female accompanied by {CHL AMB ACCOMPANIED FA:[id number]}[phone]} Patient was referred by Dr Jolayne Pantheronstant for ***. Patient reports the following symptoms/concerns: Pt states her primary concern today is ***  Duration of problem: ***; Severity of problem: {Mild/Moderate/Severe:20260}  OBJECTIVE: Mood: {BHH MOOD:22306} and Affect: {BHH AFFECT:22307} Risk of harm to self or others: {CHL AMB BH Suicide Current Mental Status:21022748}  LIFE CONTEXT: Family and Social: *** School/Work: *** Self-Care: *** Life Changes: Current pregnancy ***  GOALS ADDRESSED: Patient will: 1. Reduce symptoms of: {IBH Symptoms:21014056} 2. Increase knowledge and/or ability of: {IBH Patient Tools:21014057}  3. Demonstrate ability to: {IBH Goals:21014053}  INTERVENTIONS: Interventions utilized: {IBH Interventions:21014054}  Standardized Assessments completed: GAD-7 and PHQ 9  ASSESSMENT: Patient currently experiencing ***.   Patient may benefit from psychoeducation and brief therapeutic interventions regarding coping with symptoms of ***.  PLAN: 1. Follow up with behavioral health clinician on : *** 2. Behavioral recommendations:  -*** -*** -Read educational materials regarding coping with symptoms of ***  3. Referral(s): {IBH Referrals:21014055} 4. "From scale of 1-10, how likely are you to follow plan?": ***  Brittany Halliday C Basya Casavant, LCSW  ***

## 2017-10-06 NOTE — Progress Notes (Signed)
   PRENATAL VISIT NOTE  Subjective:  Brittany Long is a 26 y.o. G2P0010 at 3529w0d being seen today for ongoing prenatal care. [facility]  She is currently monitored for the following issues for this high-risk pregnancy and has Avitaminosis D; [redacted] weeks gestation of pregnancy; Hereditary disease in family possibly affecting fetus, affecting management of mother, antepartum condition or complication, not applicable or unspecified fetus; Supervision of high-risk pregnancy; and Chronic hypertension during pregnancy, antepartum on their problem list.  Patient reports backache.  Contractions: Not present. Vag. Bleeding: None.  Movement: Present. Denies leaking of fluid.   The following portions of the patient's history were reviewed and updated as appropriate: allergies, current medications, past family history, past medical history, past social history, past surgical history and problem list. Problem list updated.  Objective:   Vitals:   10/06/17 1333  BP: 132/68  Pulse: 91  Weight: 166 lb 14.4 oz (75.7 kg)    Fetal Status: Fetal Heart Rate (bpm): 145   Movement: Present     General:  Alert, oriented and cooperative. Patient is in no acute distress.  Skin: Skin is warm and dry. No rash noted.   Cardiovascular: Normal heart rate noted  Respiratory: Normal respiratory effort, no problems with respiration noted  Abdomen: Soft, gravid, appropriate for gestational age.  Pain/Pressure: Present     Pelvic: Cervical exam deferred        Extremities: Normal range of motion.  Edema: Trace  Mental Status:  Normal mood and affect. Normal behavior. Normal judgment and thought content.   Assessment and Plan:  Pregnancy: G2P0010 at 1729w0d  1. Supervision of high risk pregnancy in second trimester Patient is doing well without complaints Rx maternity support belt provided  2. Chronic hypertension during pregnancy, antepartum BP stable without meds Rx  ASA provided Growth ultrasound ordered  Preterm labor symptoms and general obstetric precautions including but not limited to vaginal bleeding, contractions, leaking of fluid and fetal movement were reviewed in detail with the patient. Please refer to After Visit Summary for other counseling recommendations.  No Follow-up on file.   Catalina AntiguaPeggy Aurie Harroun, MD

## 2017-10-06 NOTE — Progress Notes (Addendum)
OB US scheduled for 11/07/16 @8 :15am. Pt notified. Educated pt. On benefits of breast feeding for mom & baby.

## 2017-10-07 ENCOUNTER — Telehealth: Payer: Self-pay

## 2017-10-07 NOTE — Telephone Encounter (Signed)
[facility] c/o of need of dental service.  I spoke with a Joni Reiningicole who informed me that the pt has already been seen and if pt needed to be seen again to have pt call for appt @ [phone] opt 5.  LM for pt stating [place] from [place] and if pt had any questions to please give the office a call.

## 2017-11-03 ENCOUNTER — Encounter: Payer: Self-pay | Admitting: Family Medicine

## 2017-11-03 ENCOUNTER — Ambulatory Visit (INDEPENDENT_AMBULATORY_CARE_PROVIDER_SITE_OTHER): Payer: Medicaid Other | Admitting: Obstetrics & Gynecology

## 2017-11-03 VITALS — BP 131/60 | HR 68 | Wt 165.7 lb

## 2017-11-03 DIAGNOSIS — O10913 Unspecified pre-existing hypertension complicating pregnancy, third trimester: Secondary | ICD-10-CM

## 2017-11-03 DIAGNOSIS — O10919 Unspecified pre-existing hypertension complicating pregnancy, unspecified trimester: Secondary | ICD-10-CM

## 2017-11-03 DIAGNOSIS — O0993 Supervision of high risk pregnancy, unspecified, third trimester: Secondary | ICD-10-CM

## 2017-11-03 NOTE — Patient Instructions (Signed)
Return to clinic for any scheduled appointments or obstetric concerns, or go to [facility] for evaluation  

## 2017-11-03 NOTE — Progress Notes (Signed)
   PRENATAL VISIT NOTE  Subjective:  Brittany Long is a 27 y.o. G2P0010 at 6756w0d being seen today for ongoing prenatal care.  She is currently monitored for the following issues for this high-risk pregnancy and has Avitaminosis D; Hereditary disease in family possibly affecting fetus, affecting management of mother, antepartum condition or complication, not applicable or unspecified fetus; Supervision of high-risk pregnancy; and Chronic hypertension during pregnancy, antepartum on their problem list.  Patient reports no complaints.  Contractions: Not present. Vag. Bleeding: None.  Movement: Present. Denies leaking of fluid.   The following portions of the patient's history were reviewed and updated as appropriate: allergies, current medications, past family history, past medical history, past social history, past surgical history and problem list. Problem list updated.  Objective:   Vitals:   11/03/17 1648  BP: 131/60  Pulse: 68  Weight: 165 lb 11.2 oz (75.2 kg)    Fetal Status: Fetal Heart Rate (bpm): 132 Fundal Height: 28 cm Movement: Present     General:  Alert, oriented and cooperative. Patient is in no acute distress.  Skin: Skin is warm and dry. No rash noted.   Cardiovascular: Normal heart rate noted  Respiratory: Normal respiratory effort, no problems with respiration noted  Abdomen: Soft, gravid, appropriate for gestational age.  Pain/Pressure: Absent     Pelvic: Cervical exam deferred        Extremities: Normal range of motion.  Edema: Trace  Mental Status:  Normal mood and affect. Normal behavior. Normal judgment and thought content.   Assessment and Plan:  Pregnancy: G2P0010 at 2056w0d  1. Chronic hypertension during pregnancy, antepartum Stable BP  2. Supervision of high risk pregnancy in third trimester Unable to do 2 hr GTT, unable to come until afternoon. Will do 1 hr GTT instead.  Preterm labor symptoms and general obstetric precautions including but not limited  to vaginal bleeding, contractions, leaking of fluid and fetal movement were reviewed in detail with the patient. Please refer to After Visit Summary for other counseling recommendations.  Return in about 2 weeks (around 11/17/2017) for OB Visit [facility](HOB), 3rd trimester labs, 1 hr GTT, TDap.   Jaynie CollinsUgonna Jaymir Struble, MD

## 2017-11-07 ENCOUNTER — Ambulatory Visit (HOSPITAL_COMMUNITY): Payer: Medicaid Other | Attending: Obstetrics and Gynecology

## 2017-11-07 ENCOUNTER — Other Ambulatory Visit: Payer: Self-pay

## 2017-11-17 ENCOUNTER — Encounter: Payer: Self-pay | Admitting: Obstetrics and Gynecology

## 2017-11-17 ENCOUNTER — Ambulatory Visit (INDEPENDENT_AMBULATORY_CARE_PROVIDER_SITE_OTHER): Payer: Medicaid Other | Admitting: Obstetrics and Gynecology

## 2017-11-17 VITALS — BP 121/74 | HR 96 | Wt 164.0 lb

## 2017-11-17 DIAGNOSIS — O0933 Supervision of pregnancy with insufficient antenatal care, third trimester: Secondary | ICD-10-CM | POA: Diagnosis not present

## 2017-11-17 DIAGNOSIS — O10913 Unspecified pre-existing hypertension complicating pregnancy, third trimester: Secondary | ICD-10-CM

## 2017-11-17 DIAGNOSIS — O0993 Supervision of high risk pregnancy, unspecified, third trimester: Secondary | ICD-10-CM | POA: Diagnosis not present

## 2017-11-17 DIAGNOSIS — O99810 Abnormal glucose complicating pregnancy: Secondary | ICD-10-CM | POA: Diagnosis not present

## 2017-11-17 DIAGNOSIS — O26899 Other specified pregnancy related conditions, unspecified trimester: Secondary | ICD-10-CM

## 2017-11-17 DIAGNOSIS — O10919 Unspecified pre-existing hypertension complicating pregnancy, unspecified trimester: Secondary | ICD-10-CM

## 2017-11-17 DIAGNOSIS — Z6791 Unspecified blood type, Rh negative: Secondary | ICD-10-CM | POA: Insufficient documentation

## 2017-11-17 MED ORDER — TETANUS-DIPHTH-ACELL PERTUSSIS 5-2.5-18.5 LF-MCG/0.5 IM SUSP
0.5000 mL | Freq: Once | INTRAMUSCULAR | Status: AC
  Administered 2017-11-17: 0.5 mL via INTRAMUSCULAR

## 2017-11-17 MED ORDER — RHO D IMMUNE GLOBULIN 1500 UNIT/2ML IJ SOSY
300.0000 ug | PREFILLED_SYRINGE | Freq: Once | INTRAMUSCULAR | Status: AC
  Administered 2017-11-17: 300 ug via INTRAMUSCULAR

## 2017-11-17 NOTE — Progress Notes (Signed)
Prenatal Visit Note Date: 11/17/2017 Clinic: [facility]  Subjective:  Brittany Long is a 27 y.o. G2P0010 at 5690w0d being seen today for ongoing prenatal care.  She is currently monitored for the following issues for this high-risk pregnancy and has Avitaminosis D; Family history of autism; Supervision of high-risk pregnancy; Chronic hypertension during pregnancy, antepartum; Insufficient prenatal care; Rh negative state in antepartum period; and Abnormal glucose affecting pregnancy on their problem list.  Patient reports no complaints.   Contractions: Not present. Vag. Bleeding: None.  Movement: Present. Denies leaking of fluid.   The following portions of the patient's history were reviewed and updated as appropriate: allergies, current medications, past family history, past medical history, past social history, past surgical history and problem list. Problem list updated.  Objective:   Vitals:   11/17/17 1519  BP: 121/74  Pulse: 96  Weight: 164 lb (74.4 kg)    Fetal Status: Fetal Heart Rate (bpm): 143 Fundal Height: 31 cm Movement: Present     General:  Alert, oriented and cooperative. Patient is in no acute distress.  Skin: Skin is warm and dry. No rash noted.   Cardiovascular: Normal heart rate noted  Respiratory: Normal respiratory effort, no problems with respiration noted  Abdomen: Soft, gravid, appropriate for gestational age. Pain/Pressure: Present     Pelvic:  Cervical exam deferred        Extremities: Normal range of motion.  Edema: Trace  Mental Status: Normal mood and affect. Normal behavior. Normal judgment and thought content.   Urinalysis:      Assessment and Plan:  Pregnancy: G2P0010 at 5890w0d  1. Chronic hypertension during pregnancy, antepartum Didn't know about 1/21 growth u/s. Will r/s. D/w her re: starting ap testing at 32wks - US MFM OB FOLLOW UP; Future  2. Supervision of high risk pregnancy in third trimester Pt to come back for  28wk labs. 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 Pt told to please make lab only visit for this before nv rob - CBC; Future - HIV antibody (with reflex); Future - RPR; Future - Glucose Tolerance, 2 Hours w/1 Hour; Future - US MFM OB FOLLOW UP; Future  3.Rh negative state in antepartum period Rhogam today  4. Abnormal glucose affecting pregnancy See above  Preterm labor symptoms and general obstetric precautions including but not limited to vaginal bleeding, contractions, leaking of fluid and fetal movement were reviewed in detail with the patient. Please refer to After Visit Summary for other counseling recommendations.  Return in about 2 weeks (around 12/01/2017) for hrob, bpp, nst.   [place] BingPickens, Tatsuya Okray, MD

## 2017-11-21 ENCOUNTER — Other Ambulatory Visit: Payer: Self-pay | Admitting: Obstetrics and Gynecology

## 2017-11-21 ENCOUNTER — Ambulatory Visit (HOSPITAL_COMMUNITY)
Admission: RE | Admit: 2017-11-21 | Discharge: 2017-11-21 | Disposition: A | Discharge status: Home / Self Care | Payer: Medicaid Other | Source: Ambulatory Visit | Attending: Obstetrics and Gynecology | Admitting: Obstetrics and Gynecology

## 2017-11-21 DIAGNOSIS — O0993 Supervision of high risk pregnancy, unspecified, third trimester: Secondary | ICD-10-CM

## 2017-11-21 DIAGNOSIS — Z362 Encounter for other antenatal screening follow-up: Secondary | ICD-10-CM

## 2017-11-21 DIAGNOSIS — Z3A30 30 weeks gestation of pregnancy: Secondary | ICD-10-CM

## 2017-11-21 DIAGNOSIS — O10919 Unspecified pre-existing hypertension complicating pregnancy, unspecified trimester: Secondary | ICD-10-CM

## 2017-11-30 ENCOUNTER — Ambulatory Visit (HOSPITAL_COMMUNITY): Payer: Medicaid Other

## 2017-11-30 ENCOUNTER — Encounter: Payer: Self-pay | Admitting: General Practice

## 2017-11-30 ENCOUNTER — Ambulatory Visit (INDEPENDENT_AMBULATORY_CARE_PROVIDER_SITE_OTHER): Payer: Medicaid Other | Admitting: Obstetrics and Gynecology

## 2017-11-30 ENCOUNTER — Ambulatory Visit: Payer: Self-pay

## 2017-11-30 ENCOUNTER — Encounter: Payer: Self-pay | Admitting: *Deleted

## 2017-11-30 ENCOUNTER — Other Ambulatory Visit (HOSPITAL_COMMUNITY)
Admission: RE | Admit: 2017-11-30 | Discharge: 2017-11-30 | Disposition: A | Discharge status: Home / Self Care | Payer: Medicaid Other | Source: Ambulatory Visit | Attending: Obstetrics and Gynecology | Admitting: Obstetrics and Gynecology

## 2017-11-30 ENCOUNTER — Other Ambulatory Visit: Payer: Self-pay | Admitting: *Deleted

## 2017-11-30 ENCOUNTER — Ambulatory Visit (INDEPENDENT_AMBULATORY_CARE_PROVIDER_SITE_OTHER): Payer: Medicaid Other | Admitting: *Deleted

## 2017-11-30 VITALS — BP 117/71 | HR 78 | Wt 169.0 lb

## 2017-11-30 DIAGNOSIS — O99810 Abnormal glucose complicating pregnancy: Secondary | ICD-10-CM

## 2017-11-30 DIAGNOSIS — O10919 Unspecified pre-existing hypertension complicating pregnancy, unspecified trimester: Secondary | ICD-10-CM

## 2017-11-30 DIAGNOSIS — O26893 Other specified pregnancy related conditions, third trimester: Secondary | ICD-10-CM | POA: Insufficient documentation

## 2017-11-30 DIAGNOSIS — O10913 Unspecified pre-existing hypertension complicating pregnancy, third trimester: Secondary | ICD-10-CM

## 2017-11-30 DIAGNOSIS — O0993 Supervision of high risk pregnancy, unspecified, third trimester: Secondary | ICD-10-CM

## 2017-11-30 DIAGNOSIS — N898 Other specified noninflammatory disorders of vagina: Secondary | ICD-10-CM | POA: Insufficient documentation

## 2017-11-30 DIAGNOSIS — O0990 Supervision of high risk pregnancy, unspecified, unspecified trimester: Secondary | ICD-10-CM

## 2017-11-30 DIAGNOSIS — O26899 Other specified pregnancy related conditions, unspecified trimester: Secondary | ICD-10-CM

## 2017-11-30 DIAGNOSIS — Z3A31 31 weeks gestation of pregnancy: Secondary | ICD-10-CM | POA: Diagnosis not present

## 2017-11-30 DIAGNOSIS — Z818 Family history of other mental and behavioral disorders: Secondary | ICD-10-CM

## 2017-11-30 DIAGNOSIS — Z6791 Unspecified blood type, Rh negative: Secondary | ICD-10-CM

## 2017-11-30 NOTE — Progress Notes (Signed)
   PRENATAL VISIT NOTE  Subjective:  Brittany Long is a 27 y.o. G2P0010 at 8082w6d being seen today for ongoing prenatal care.  She is currently monitored for the following issues for this high-risk pregnancy and has Avitaminosis D; Family history of autism; Supervision of high-risk pregnancy; Chronic hypertension during pregnancy, antepartum; Rh negative state in antepartum period; and Abnormal glucose affecting pregnancy on their problem list.  Patient reports vomiting and discharge.  Contractions: Not present. Vag. Bleeding: None.  Movement: Present. Denies leaking of fluid.   The following portions of the patient's history were reviewed and updated as appropriate: allergies, current medications, past family history, past medical history, past social history, past surgical history and problem list. Problem list updated.  Objective:   Vitals:   11/30/17 1550  BP: 117/71  Pulse: 78  Weight: 169 lb (76.7 kg)    Fetal Status: Fetal Heart Rate (bpm): NST   Movement: Present     General:  Alert, oriented and cooperative. Patient is in no acute distress.  Skin: Skin is warm and dry. No rash noted.   Cardiovascular: Normal heart rate noted  Respiratory: Normal respiratory effort, no problems with respiration noted  Abdomen: Soft, gravid, appropriate for gestational age.  Pain/Pressure: Present     Pelvic: Cervical exam deferred        Extremities: Normal range of motion.  Edema: Trace  Mental Status:  Normal mood and affect. Normal behavior. Normal judgment and thought content.   Assessment and Plan:  Pregnancy: G2P0010 at 4282w6d  1. 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 Chronic hypertension during pregnancy, antepartum No meds Stable BP BPP today 8/8 NST reactive  3.  Rh negative state in antepartum period Rho gam givem 11/17/17  4. Abnormal glucose affecting pregnancy Has not done 28 week 2 hr GTT  5. Family history of autism counsyl negative  6. Itching Cervicovaginal ancillary sent  Preterm labor symptoms and general obstetric precautions including but not limited to vaginal bleeding, contractions, leaking of fluid and fetal movement were reviewed in detail with the patient. Please refer to After Visit Summary for other counseling recommendations.  Return in about 7 days (around 12/07/2017) for weekly as scheduled, 2 hr GTT.   Conan BowensKelly M Kalid Ghan, MD

## 2017-11-30 NOTE — Progress Notes (Signed)
Pt reports thick white vaginal discharge with some itching. She also reports having recent episodes of sudden vomiting during the night which wake her up out of a deep sleep. Pt has concerns about discoloration of skin on breasts. US for growth done 2/4.

## 2017-11-30 NOTE — Progress Notes (Unsigned)
bpp

## 2017-11-30 NOTE — Progress Notes (Signed)

## 2017-12-01 NOTE — Progress Notes (Signed)
I have reviewed this chart and agree with the RN/CMA assessment and management.    K. Meryl Sheehan Stacey, M.D. Attending Obstetrician & Gynecologist, [facility], [facility]  

## 2017-12-02 ENCOUNTER — Other Ambulatory Visit: Payer: Medicaid Other

## 2017-12-02 ENCOUNTER — Other Ambulatory Visit: Payer: Self-pay | Admitting: *Deleted

## 2017-12-02 DIAGNOSIS — O0993 Supervision of high risk pregnancy, unspecified, third trimester: Secondary | ICD-10-CM

## 2017-12-02 DIAGNOSIS — O0990 Supervision of high risk pregnancy, unspecified, unspecified trimester: Secondary | ICD-10-CM

## 2017-12-02 LAB — CERVICOVAGINAL ANCILLARY ONLY
Bacterial vaginitis: NEGATIVE
CANDIDA VAGINITIS: NEGATIVE
CHLAMYDIA, DNA PROBE: NEGATIVE
Neisseria Gonorrhea: NEGATIVE
Trichomonas: NEGATIVE

## 2017-12-03 LAB — CBC
Hematocrit: 39.6 % (ref 34.0–46.6)
Hemoglobin: 13.2 g/dL (ref 11.1–15.9)
MCH: 28.3 pg (ref 26.6–33.0)
MCHC: 33.3 g/dL (ref 31.5–35.7)
MCV: 85 fL (ref 79–97)
PLATELETS: 218 10*3/uL (ref 150–379)
RBC: 4.66 x10E6/uL (ref 3.77–5.28)
RDW: 14.2 % (ref 12.3–15.4)
WBC: 7.5 10*3/uL (ref 3.4–10.8)

## 2017-12-03 LAB — GLUCOSE TOLERANCE, 2 HOURS W/ 1HR
GLUCOSE, 1 HOUR: 163 mg/dL (ref 65–179)
GLUCOSE, 2 HOUR: 147 mg/dL (ref 65–152)
Glucose, Fasting: 69 mg/dL (ref 65–91)

## 2017-12-03 LAB — RPR: RPR Ser Ql: NONREACTIVE

## 2017-12-03 LAB — HIV ANTIBODY (ROUTINE TESTING W REFLEX): HIV SCREEN 4TH GENERATION: NONREACTIVE

## 2017-12-07 ENCOUNTER — Ambulatory Visit (INDEPENDENT_AMBULATORY_CARE_PROVIDER_SITE_OTHER): Payer: Medicaid Other | Admitting: *Deleted

## 2017-12-07 ENCOUNTER — Ambulatory Visit (INDEPENDENT_AMBULATORY_CARE_PROVIDER_SITE_OTHER): Payer: Medicaid Other | Admitting: Obstetrics and Gynecology

## 2017-12-07 ENCOUNTER — Ambulatory Visit: Payer: Self-pay

## 2017-12-07 VITALS — BP 116/59 | HR 79 | Wt 172.0 lb

## 2017-12-07 DIAGNOSIS — O10913 Unspecified pre-existing hypertension complicating pregnancy, third trimester: Secondary | ICD-10-CM

## 2017-12-07 DIAGNOSIS — O10919 Unspecified pre-existing hypertension complicating pregnancy, unspecified trimester: Secondary | ICD-10-CM

## 2017-12-07 DIAGNOSIS — O0993 Supervision of high risk pregnancy, unspecified, third trimester: Secondary | ICD-10-CM

## 2017-12-07 NOTE — Progress Notes (Signed)
Prenatal Visit Note Date: 12/07/2017 Clinic: [facility]  Subjective:  Brittany Long is a 27 y.o. G2P0010 at 7384w6d being seen today for ongoing prenatal care.  She is currently monitored for the following issues for this high-risk pregnancy and has Avitaminosis D; Family history of autism; Supervision of high-risk pregnancy; Chronic hypertension during pregnancy, antepartum; Rh negative state in antepartum period; and Abnormal glucose affecting pregnancy on their problem list.  Patient reports no complaints.   Contractions: Not present. Vag. Bleeding: None.  Movement: Present. Denies leaking of fluid.   The following portions of the patient's history were reviewed and updated as appropriate: allergies, current medications, past family history, past medical history, past social history, past surgical history and problem list. Problem list updated.  Objective:   Vitals:   12/07/17 1550  BP: (!) 116/59  Pulse: 79  Weight: 172 lb (78 kg)    Fetal Status: Fetal Heart Rate (bpm): NST   Movement: Present     General:  Alert, oriented and cooperative. Patient is in no acute distress.  Skin: Skin is warm and dry. No rash noted.   Cardiovascular: Normal heart rate noted  Respiratory: Normal respiratory effort, no problems with respiration noted  Abdomen: Soft, gravid, appropriate for gestational age. Pain/Pressure: Present     Pelvic:  Cervical exam deferred        Extremities: Normal range of motion.  Edema: Trace  Mental Status: Normal mood and affect. Normal behavior. Normal judgment and thought content.   Urinalysis:      Assessment and Plan:  Pregnancy: G2P0010 at 5084w6d  1. Chronic hypertension during pregnancy, antepartum Doing well on no meds. bpp 10/10 - US MFM OB FOLLOW UP; Future - US MFM FETAL BPP WO NON STRESS; Future  2. Supervision of high risk pregnancy in third trimester  Preterm labor symptoms and general obstetric precautions including but not  limited to vaginal bleeding, contractions, leaking of fluid and fetal movement were reviewed in detail with the patient. Please refer to After Visit Summary for other counseling recommendations.  Return in about 7 days (around 12/14/2017) for as scheduled.   [place] BingPickens, Kaly Mcquary, MD

## 2017-12-07 NOTE — Progress Notes (Signed)

## 2017-12-14 ENCOUNTER — Ambulatory Visit: Payer: Self-pay

## 2017-12-14 ENCOUNTER — Encounter (INDEPENDENT_AMBULATORY_CARE_PROVIDER_SITE_OTHER): Payer: Self-pay

## 2017-12-14 ENCOUNTER — Ambulatory Visit (INDEPENDENT_AMBULATORY_CARE_PROVIDER_SITE_OTHER): Payer: Medicaid Other | Admitting: *Deleted

## 2017-12-14 ENCOUNTER — Encounter: Payer: Self-pay | Admitting: Obstetrics and Gynecology

## 2017-12-14 VITALS — BP 122/64 | HR 82

## 2017-12-14 DIAGNOSIS — O10919 Unspecified pre-existing hypertension complicating pregnancy, unspecified trimester: Secondary | ICD-10-CM

## 2017-12-14 NOTE — Progress Notes (Signed)
NST performed today was reviewed and was found to be reactive. Subsequent BPP performed today was also reviewed and was found to be 10/10. AFI was also normal. Continue recommended antenatal testing and prenatal care.  Gurshan Settlemire, MD, FACOG Obstetrician & Gynecologist, [facility], [facility]   

## 2017-12-14 NOTE — Progress Notes (Signed)

## 2017-12-21 ENCOUNTER — Ambulatory Visit (INDEPENDENT_AMBULATORY_CARE_PROVIDER_SITE_OTHER): Payer: Medicaid Other | Admitting: General Practice

## 2017-12-21 ENCOUNTER — Ambulatory Visit: Payer: Self-pay

## 2017-12-21 ENCOUNTER — Ambulatory Visit: Payer: Self-pay | Admitting: Obstetrics and Gynecology

## 2017-12-21 ENCOUNTER — Encounter: Payer: Self-pay | Admitting: Obstetrics and Gynecology

## 2017-12-21 VITALS — BP 133/73 | HR 87 | Wt 178.0 lb

## 2017-12-21 DIAGNOSIS — O0993 Supervision of high risk pregnancy, unspecified, third trimester: Secondary | ICD-10-CM

## 2017-12-21 DIAGNOSIS — O10919 Unspecified pre-existing hypertension complicating pregnancy, unspecified trimester: Secondary | ICD-10-CM | POA: Diagnosis present

## 2017-12-21 DIAGNOSIS — Z6791 Unspecified blood type, Rh negative: Secondary | ICD-10-CM

## 2017-12-21 DIAGNOSIS — Z029 Encounter for administrative examinations, unspecified: Secondary | ICD-10-CM

## 2017-12-21 DIAGNOSIS — O10913 Unspecified pre-existing hypertension complicating pregnancy, third trimester: Secondary | ICD-10-CM

## 2017-12-21 DIAGNOSIS — O26899 Other specified pregnancy related conditions, unspecified trimester: Secondary | ICD-10-CM

## 2017-12-21 NOTE — Progress Notes (Signed)
Pt informed that the ultrasound is considered a limited OB ultrasound and is not intended to be a complete ultrasound exam.  Patient also informed that the ultrasound is not being completed with the intent of assessing for fetal or placental anomalies or any pelvic abnormalities.  Explained that the purpose of today's ultrasound is to assess for  BPP, presentation and AFI.  Patient acknowledges the purpose of the exam and the limitations of the study.    

## 2017-12-21 NOTE — Progress Notes (Signed)
   PRENATAL VISIT NOTE  Subjective:  Brittany Long is a 27 y.o. G2P0010 at 5961w6d being seen today for ongoing prenatal care.  She is currently monitored for the following issues for this high-risk pregnancy and has Avitaminosis D; Family history of autism; Supervision of high-risk pregnancy; Chronic hypertension during pregnancy, antepartum; Rh negative state in antepartum period; and Abnormal glucose affecting pregnancy on their problem list.  Patient reports backache.  Contractions: Irritability. Vag. Bleeding: None.  Movement: Present. Denies leaking of fluid.   The following portions of the patient's history were reviewed and updated as appropriate: allergies, current medications, past family history, past medical history, past social history, past surgical history and problem list. Problem list updated.  Objective:   Vitals:   12/21/17 1529  BP: 133/73  Pulse: 87  Weight: 178 lb (80.7 kg)    Fetal Status: Fetal Heart Rate (bpm): NST    Movement: Present  Presentation: Vertex  General:  Alert, oriented and cooperative. Patient is in no acute distress.  Skin: Skin is warm and dry. No rash noted.   Cardiovascular: Normal heart rate noted  Respiratory: Normal respiratory effort, no problems with respiration noted  Abdomen: Soft, gravid, appropriate for gestational age.  Pain/Pressure: Present     Pelvic: Cervical exam deferred        Extremities: Normal range of motion.  Edema: Trace  Mental Status:  Normal mood and affect. Normal behavior. Normal judgment and thought content.   Assessment and Plan:  Pregnancy: G2P0010 at 3661w6d  1. Chronic hypertension during pregnancy, antepartum BP stable without medication Continue ASA BPP 10/10 today (NST reviewed and reactive) Continue antenatal testing Follow up growth ultrasound 3/13  2. Supervision of high risk pregnancy in third trimester Patient is doing well complaining of lower back pain. Patient states that maternity support  belt is not helping Advised to stay active and stretch  3. Rh negative state in antepartum period S/p rhogam  Preterm labor symptoms and general obstetric precautions including but not limited to vaginal bleeding, contractions, leaking of fluid and fetal movement were reviewed in detail with the patient. Please refer to After Visit Summary for other counseling recommendations.  No Follow-up on file.   Catalina AntiguaPeggy Rahn Lacuesta, MD

## 2017-12-21 NOTE — Patient Instructions (Signed)
Places to have your son circumcised:    [facility] [phone] $480 while you are in hospital  [facility] [phone] $244 by 4 wks  [facility] [phone] $[phone] by 2 wks  [facility] [phone] $250 by 7 days [facility] [phone] $269 by 4 wks  These prices sometimes change but are roughly what you can expect to pay. Please call and confirm pricing.   Circumcision is considered an elective/non-medically necessary procedure. There are many reasons parents decide to have their sons circumsized. During the first year of life circumcised males have a reduced risk of urinary tract infections but after this year the rates between circumcised males and uncircumcised males are the same.  It is safe to have your son circumcised outside of the hospital and the places above perform them regularly.   Deciding about Circumcision in Baby Boys  (Up-to-date The Basics)  What is circumcision?  Circumcision is a surgery that removes the skin that covers the tip of the penis, called the "foreskin" Circumcision is usually done when a boy is between 1 and 10 days old. In the [place], circumcision is common. In some other countries, fewer boys are circumcised. Circumcision is a common tradition in some religions.  Should I have my baby boy circumcised?  There is no easy answer. Circumcision has some benefits. But it also has risks. After talking with your doctor, you will have to decide for yourself what is right for your family.  What are the benefits of circumcision?  Circumcised boys seem to have slightly lower rates of: ?Urinary tract infections ?Swelling of the opening at the tip of the penis Circumcised men seem to have slightly lower rates of: ?Urinary tract infections ?Swelling of the opening at the tip of the penis ?Penis  cancer ?HIV and other infections that you catch during sex ?Cervical cancer in the women they have sex with Even so, in the [place], the risks of these problems are small - even in boys and men who have not been circumcised. Plus, boys and men who are not circumcised can reduce these extra risks by: ?Cleaning their penis well ?Using condoms during sex  What are the risks of circumcision?  Risks include: ?Bleeding or infection from the surgery ?Damage to or amputation of the penis ?A chance that the doctor will cut off too much or not enough of the foreskin ?A chance that sex won't feel as good later in life Only about 1 out of every 200 circumcisions leads to problems. There is also a chance that your health insurance won't pay for circumcision.  How is circumcision done in baby boys?  First, the baby gets medicine for pain relief. This might be a cream on the skin or a shot into the base of the penis. Next, the doctor cleans the baby's penis well. Then he or she uses special tools to cut off the foreskin. Finally, the doctor wraps a bandage (called gauze) around the baby's penis. If you have your baby circumcised, his doctor or nurse will give you instructions on how to care for him after the surgery. It is important that you follow those instructions carefully. AREA PEDIATRIC/FAMILY PRACTICE PHYSICIANS  [facility] [address], [address] [place], [place]  [address] Phone - [phone]   Fax - [phone]  [facility] [address] [place], [place] [address] Phone - [phone]   Fax - [phone]  [facility] [address] , [place]    [address] Phone - [phone]   Fax - [phone]  [facility] [address] [place], [place]  [address] Phone - [phone]   Fax - [phone]  [facility] [address] [place], [place]  [address] Phone - [phone]   Fax -  [phone]  [facility] [address], [address] [place], [place]  [address] Phone - [phone]   Fax - [phone]  [facility] [address] [place], [place]  [address] Phone - [phone]   Fax - [phone]  [facility] [address], [address] [place], [place]  [address] Phone - [phone]   Fax - [phone]  [facility] [address] [place], [place]  [address] Phone - [phone]   Fax - [phone] [facility] [address] [place], [place]  [address] Phone - [phone]   Fax - [phone]  [facility] [address] [place], [place]  [address] Phone - [phone]   Fax - [phone]  [facility] [address] [place], [place]  [address] Phone - [phone]   Fax - [phone]  [facility] [address], [address] [place], [place]  [address] Phone - [phone]   Fax - [phone]  [facility] [address] [place], [place]  [address] Phone - [phone]  [facility] [address] [place], [place]  [address] Phone - [phone]   Fax - [phone]  [facility] [address] [place], [place]  [address] Phone - [phone]   Fax - [phone]  [facility] [address] [place], [place]  [address] Phone - [phone]   Fax - [phone]  [facility] [address] [place], [place]  [address] Phone - [phone]   Fax - [phone]   [facility] [address] [place], [place]  [address] Phone - [phone]   Fax - [phone]  [facility] [address] [place], [place]  [address] Phone - [phone]   Fax - [phone]  [facility] [address], [address] [place], [place]  [address] Phone - [phone]   Fax - [phone]  [facility] [address] [place], [place]  [address] Phone - [phone]   Fax - [phone]  [facility] [address] [place], [place]  [address] Phone - [phone]   Fax - [phone] [facility] [address] [place], [place]  [address] Phone - [phone]   Fax - [phone]  [facility] [address] [place], [place]  [address] Phone - [phone]   Fax - [phone]  [facility] [address] [place], [place]  [address] Phone - [phone]   Fax - [phone]  [facility] Charlene Flemming MD [address] [place] [address] Phone [phone]  Fax [phone]   

## 2017-12-28 ENCOUNTER — Encounter (HOSPITAL_COMMUNITY): Payer: Self-pay

## 2017-12-28 ENCOUNTER — Encounter: Payer: Self-pay | Admitting: Obstetrics and Gynecology

## 2017-12-28 ENCOUNTER — Ambulatory Visit (INDEPENDENT_AMBULATORY_CARE_PROVIDER_SITE_OTHER): Payer: Medicaid Other | Admitting: *Deleted

## 2017-12-28 ENCOUNTER — Ambulatory Visit (HOSPITAL_COMMUNITY)
Admission: RE | Admit: 2017-12-28 | Discharge: 2017-12-28 | Disposition: A | Discharge status: Home / Self Care | Payer: Medicaid Other | Source: Ambulatory Visit | Attending: Obstetrics and Gynecology | Admitting: Obstetrics and Gynecology

## 2017-12-28 ENCOUNTER — Other Ambulatory Visit: Payer: Self-pay | Admitting: Obstetrics and Gynecology

## 2017-12-28 VITALS — BP 122/75 | HR 82

## 2017-12-28 DIAGNOSIS — O10919 Unspecified pre-existing hypertension complicating pregnancy, unspecified trimester: Secondary | ICD-10-CM

## 2017-12-28 DIAGNOSIS — Z029 Encounter for administrative examinations, unspecified: Secondary | ICD-10-CM

## 2017-12-28 DIAGNOSIS — Z3A35 35 weeks gestation of pregnancy: Secondary | ICD-10-CM

## 2017-12-28 DIAGNOSIS — Z362 Encounter for other antenatal screening follow-up: Secondary | ICD-10-CM | POA: Insufficient documentation

## 2017-12-28 DIAGNOSIS — O0993 Supervision of high risk pregnancy, unspecified, third trimester: Secondary | ICD-10-CM

## 2017-12-28 DIAGNOSIS — O09893 Supervision of other high risk pregnancies, third trimester: Secondary | ICD-10-CM | POA: Diagnosis not present

## 2017-12-28 DIAGNOSIS — O10013 Pre-existing essential hypertension complicating pregnancy, third trimester: Secondary | ICD-10-CM | POA: Insufficient documentation

## 2017-12-28 NOTE — Progress Notes (Signed)
US for growth and BPP done today 

## 2017-12-29 NOTE — Progress Notes (Signed)
NST:  Baseline: wandering, likely 130 bpm, Variability: Good {> 6 bpm), Accelerations: Reactive and Decelerations: Absent

## 2018-01-04 ENCOUNTER — Ambulatory Visit (INDEPENDENT_AMBULATORY_CARE_PROVIDER_SITE_OTHER): Payer: Medicaid Other | Admitting: Obstetrics & Gynecology

## 2018-01-04 ENCOUNTER — Ambulatory Visit (INDEPENDENT_AMBULATORY_CARE_PROVIDER_SITE_OTHER): Payer: Medicaid Other | Admitting: General Practice

## 2018-01-04 ENCOUNTER — Ambulatory Visit: Payer: Self-pay

## 2018-01-04 VITALS — BP 145/72 | HR 61 | Wt 181.0 lb

## 2018-01-04 DIAGNOSIS — O0993 Supervision of high risk pregnancy, unspecified, third trimester: Secondary | ICD-10-CM

## 2018-01-04 DIAGNOSIS — O10919 Unspecified pre-existing hypertension complicating pregnancy, unspecified trimester: Secondary | ICD-10-CM | POA: Diagnosis not present

## 2018-01-04 DIAGNOSIS — O99810 Abnormal glucose complicating pregnancy: Secondary | ICD-10-CM

## 2018-01-04 LAB — POCT URINALYSIS DIP (DEVICE)
BILIRUBIN URINE: NEGATIVE
GLUCOSE, UA: NEGATIVE mg/dL
Hgb urine dipstick: NEGATIVE
KETONES UR: NEGATIVE mg/dL
Leukocytes, UA: NEGATIVE
Nitrite: NEGATIVE
PROTEIN: NEGATIVE mg/dL
SPECIFIC GRAVITY, URINE: 1.01 (ref 1.005–1.030)
Urobilinogen, UA: 0.2 mg/dL (ref 0.0–1.0)
pH: 7 (ref 5.0–8.0)

## 2018-01-04 LAB — OB RESULTS CONSOLE GBS: GBS: NEGATIVE

## 2018-01-04 NOTE — Patient Instructions (Signed)
Preeclampsia and Eclampsia °Preeclampsia is a serious condition that develops only during pregnancy. It is also called toxemia of pregnancy. This condition causes high blood pressure along with other symptoms, such as swelling and headaches. These symptoms may develop as the condition gets worse. Preeclampsia may occur at 20 weeks of pregnancy or later. °Diagnosing and treating preeclampsia early is very important. If not treated early, it can cause serious problems for you and your baby. One problem it can lead to is eclampsia, which is a condition that causes muscle jerking or shaking (convulsions or seizures) in the mother. Delivering your baby is the best treatment for preeclampsia or eclampsia. Preeclampsia and eclampsia symptoms usually go away after your baby is born. °What are the causes? °The cause of preeclampsia is not known. °What increases the risk? °The following risk factors make you more likely to develop preeclampsia: °· Being pregnant for the first time. °· Having had preeclampsia during a past pregnancy. °· Having a family history of preeclampsia. °· Having high blood pressure. °· Being pregnant with twins or triplets. °· Being 35 or older. °· Being African-American. °· Having kidney disease or diabetes. °· Having medical conditions such as lupus or blood diseases. °· Being very overweight (obese). ° °What are the signs or symptoms? °The earliest signs of preeclampsia are: °· High blood pressure. °· Increased protein in your urine. Your health care provider will check for this at every visit before you give birth (prenatal visit). ° °Other symptoms that may develop as the condition gets worse include: °· Severe headaches. °· Sudden weight gain. °· Swelling of the hands, face, legs, and feet. °· Nausea and vomiting. °· Vision problems, such as blurred or double vision. °· Numbness in the face, arms, legs, and feet. °· Urinating less than usual. °· Dizziness. °· Slurred speech. °· Abdominal pain,  especially upper abdominal pain. °· Convulsions or seizures. ° °Symptoms generally go away after giving birth. °How is this diagnosed? °There are no screening tests for preeclampsia. Your health care provider will ask you about symptoms and check for signs of preeclampsia during your prenatal visits. You may also have tests that include: °· Urine tests. °· Blood tests. °· Checking your blood pressure. °· Monitoring your baby’s heart rate. °· Ultrasound. ° °How is this treated? °You and your health care provider will determine the treatment approach that is best for you. Treatment may include: °· Having more frequent prenatal exams to check for signs of preeclampsia, if you have an increased risk for preeclampsia. °· Bed rest. °· Reducing how much salt (sodium) you eat. °· Medicine to lower your blood pressure. °· Staying in the hospital, if your condition is severe. There, treatment will focus on controlling your blood pressure and the amount of fluids in your body (fluid retention). °· You may need to take medicine (magnesium sulfate) to prevent seizures. This medicine may be given as an injection or through an IV tube. °· Delivering your baby early, if your condition gets worse. You may have your labor started with medicine (induced), or you may have a cesarean delivery. ° °Follow these instructions at home: °Eating and drinking ° °· Drink enough fluid to keep your urine clear or pale yellow. °· Eat a healthy diet that is low in sodium. Do not add salt to your food. Check nutrition labels to see how much sodium a food or beverage contains. °· Avoid caffeine. °Lifestyle °· Do not use any products that contain nicotine or tobacco, such as cigarettes   and e-cigarettes. If you need help quitting, ask your health care provider. °· Do not use alcohol or drugs. °· Avoid stress as much as possible. Rest and get plenty of sleep. °General instructions °· Take over-the-counter and prescription medicines only as told by your  health care provider. °· When lying down, lie on your side. This keeps pressure off of your baby. °· When sitting or lying down, raise (elevate) your feet. Try putting some pillows underneath your lower legs. °· Exercise regularly. Ask your health care provider what kinds of exercise are best for you. °· Keep all follow-up and prenatal visits as told by your health care provider. This is important. °How is this prevented? °To prevent preeclampsia or eclampsia from developing during another pregnancy: °· Get proper medical care during pregnancy. Your health care provider may be able to prevent preeclampsia or diagnose and treat it early. °· Your health care provider may have you take a low-dose aspirin or a calcium supplement during your next pregnancy. °· You may have tests of your blood pressure and kidney function after giving birth. °· Maintain a healthy weight. Ask your health care provider for help managing weight gain during pregnancy. °· Work with your health care provider to manage any long-term (chronic) health conditions you have, such as diabetes or kidney problems. ° °Contact a health care provider if: °· You gain more weight than expected. °· You have headaches. °· You have nausea or vomiting. °· You have abdominal pain. °· You feel dizzy or light-headed. °Get help right away if: °· You develop sudden or severe swelling anywhere in your body. This usually happens in the legs. °· You gain 5 lbs (2.3 kg) or more during one week. °· You have severe: °? Abdominal pain. °? Headaches. °? Dizziness. °? Vision problems. °? Confusion. °? Nausea or vomiting. °· You have a seizure. °· You have trouble moving any part of your body. °· You develop numbness in any part of your body. °· You have trouble speaking. °· You have any abnormal bleeding. °· You pass out. °This information is not intended to replace advice given to you by your health care provider. Make sure you discuss any questions you have with your health  care provider. °Document Released: 10/01/2000 Document Revised: 06/01/2016 Document Reviewed: 05/10/2016 °Elsevier Interactive Patient Education © 2018 [employer] ° °

## 2018-01-04 NOTE — Progress Notes (Signed)
Pt informed that the ultrasound is considered a limited OB ultrasound and is not intended to be a complete ultrasound exam.  Patient also informed that the ultrasound is not being completed with the intent of assessing for fetal or placental anomalies or any pelvic abnormalities.  Explained that the purpose of today's ultrasound is to assess for  BPP, presentation and AFI.  Patient acknowledges the purpose of the exam and the limitations of the study.    

## 2018-01-04 NOTE — Progress Notes (Signed)
   PRENATAL VISIT NOTE  Subjective:  Brittany Long is a 27 y.o. G2P0010 at 1092w6d being seen today for ongoing prenatal care.  She is currently monitored for the following issues for this high-risk pregnancy and has Avitaminosis D; Family history of autism; Supervision of high-risk pregnancy; Chronic hypertension during pregnancy, antepartum; Rh negative state in antepartum period; and Abnormal glucose affecting pregnancy on their problem list.  Patient reports no complaints.  Contractions: Irritability. Vag. Bleeding: None.  Movement: Present. Denies leaking of fluid.   The following portions of the patient's history were reviewed and updated as appropriate: allergies, current medications, past family history, past medical history, past social history, past surgical history and problem list. Problem list updated.  Objective:   Vitals:   01/04/18 1552 01/04/18 1615  BP: (!) 144/74 (!) 145/72  Pulse: 61   Weight: 181 lb (82.1 kg)     Fetal Status: Fetal Heart Rate (bpm): NST   Movement: Present     General:  Alert, oriented and cooperative. Patient is in no acute distress.  Skin: Skin is warm and dry. No rash noted.   Cardiovascular: Normal heart rate noted  Respiratory: Normal respiratory effort, no problems with respiration noted  Abdomen: Soft, gravid, appropriate for gestational age.  Pain/Pressure: Present     Pelvic: Cervical exam performed        Extremities: Normal range of motion.  Edema: Trace  Mental Status:  Normal mood and affect. Normal behavior. Normal judgment and thought content.   Assessment and Plan:  Pregnancy: G2P0010 at 7192w6d  1. Supervision of high risk pregnancy in third trimester routine - Culture, beta strep (group b only)  2. Abnormal glucose affecting pregnancy  - Culture, beta strep (group b only)  3. Chronic hypertension during pregnancy, antepartum Systolic elevated today, needs close f/u  Term labor symptoms and general obstetric precautions  including but not limited to vaginal bleeding, contractions, leaking of fluid and fetal movement were reviewed in detail with the patient. Please refer to After Visit Summary for other counseling recommendations.  Return in about 2 days (around 01/06/2018).   Scheryl DarterJames Wrangler Penning, MD

## 2018-01-06 ENCOUNTER — Ambulatory Visit: Payer: Medicaid Other | Admitting: *Deleted

## 2018-01-06 ENCOUNTER — Ambulatory Visit: Payer: Self-pay

## 2018-01-06 VITALS — BP 138/76 | HR 79 | Wt 184.1 lb

## 2018-01-06 DIAGNOSIS — O0993 Supervision of high risk pregnancy, unspecified, third trimester: Secondary | ICD-10-CM

## 2018-01-06 DIAGNOSIS — O10919 Unspecified pre-existing hypertension complicating pregnancy, unspecified trimester: Secondary | ICD-10-CM

## 2018-01-06 NOTE — Progress Notes (Signed)
Here for bp check. Denies headaches, edema, or visual changes.  Reported bp and assessment to Judeth HornErin Lawrence, Instructed patient to keep appt as scheduled for 01/11/18 and to come to [facility]U if severe headache, sudden increased edema, etc.

## 2018-01-06 NOTE — Progress Notes (Signed)
Chart reviewed for nurse visit. Agree with plan of care.   Deunta Beneke, NP 01/06/2018 6:17 PM   

## 2018-01-08 LAB — CULTURE, BETA STREP (GROUP B ONLY): STREP GP B CULTURE: NEGATIVE

## 2018-01-11 ENCOUNTER — Ambulatory Visit: Payer: Self-pay

## 2018-01-11 ENCOUNTER — Ambulatory Visit (INDEPENDENT_AMBULATORY_CARE_PROVIDER_SITE_OTHER): Payer: Medicaid Other | Admitting: Obstetrics and Gynecology

## 2018-01-11 ENCOUNTER — Ambulatory Visit (INDEPENDENT_AMBULATORY_CARE_PROVIDER_SITE_OTHER): Payer: Medicaid Other | Admitting: General Practice

## 2018-01-11 VITALS — BP 144/76 | HR 77 | Wt 182.0 lb

## 2018-01-11 DIAGNOSIS — O10919 Unspecified pre-existing hypertension complicating pregnancy, unspecified trimester: Secondary | ICD-10-CM

## 2018-01-11 DIAGNOSIS — O0993 Supervision of high risk pregnancy, unspecified, third trimester: Secondary | ICD-10-CM

## 2018-01-11 DIAGNOSIS — Z6791 Unspecified blood type, Rh negative: Secondary | ICD-10-CM

## 2018-01-11 DIAGNOSIS — O26899 Other specified pregnancy related conditions, unspecified trimester: Secondary | ICD-10-CM

## 2018-01-11 DIAGNOSIS — O10913 Unspecified pre-existing hypertension complicating pregnancy, third trimester: Secondary | ICD-10-CM

## 2018-01-11 LAB — POCT URINALYSIS DIP (DEVICE)
BILIRUBIN URINE: NEGATIVE
Glucose, UA: NEGATIVE mg/dL
Hgb urine dipstick: NEGATIVE
KETONES UR: NEGATIVE mg/dL
LEUKOCYTES UA: NEGATIVE
NITRITE: NEGATIVE
PROTEIN: 30 mg/dL — AB
Specific Gravity, Urine: 1.015 (ref 1.005–1.030)
UROBILINOGEN UA: 0.2 mg/dL (ref 0.0–1.0)
pH: 7 (ref 5.0–8.0)

## 2018-01-11 NOTE — Progress Notes (Signed)
Pt informed that the ultrasound is considered a limited OB ultrasound and is not intended to be a complete ultrasound exam.  Patient also informed that the ultrasound is not being completed with the intent of assessing for fetal or placental anomalies or any pelvic abnormalities.  Explained that the purpose of today's ultrasound is to assess for  BPP, presentation and AFI.  Patient acknowledges the purpose of the exam and the limitations of the study.    

## 2018-01-11 NOTE — Progress Notes (Signed)
   PRENATAL VISIT NOTE  Subjective:  Brittany Long is a 27 y.o. G2P0010 at 2661w6d being seen today for ongoing prenatal care.  She is currently monitored for the following issues for this high-risk pregnancy and has Avitaminosis D; Family history of autism; Supervision of high-risk pregnancy; Chronic hypertension during pregnancy, antepartum; Rh negative state in antepartum period; and Abnormal glucose affecting pregnancy on their problem list.  Patient reports occasional contractions and occasional headaches.  Contractions: Irritability. Vag. Bleeding: None.  Movement: Present. Denies leaking of fluid.   The following portions of the patient's history were reviewed and updated as appropriate: allergies, current medications, past family history, past medical history, past social history, past surgical history and problem list. Problem list updated.  Objective:   Vitals:   01/11/18 1550 01/11/18 1619  BP: (!) 148/85 (!) 144/76  Pulse: 77   Weight: 182 lb (82.6 kg)     Fetal Status: Fetal Heart Rate (bpm): SNT   Movement: Present     General:  Alert, oriented and cooperative. Patient is in no acute distress.  Skin: Skin is warm and dry. No rash noted.   Cardiovascular: Normal heart rate noted  Respiratory: Normal respiratory effort, no problems with respiration noted  Abdomen: Soft, gravid, appropriate for gestational age.  Pain/Pressure: Present     Pelvic: Cervical exam deferred        Extremities: Normal range of motion.  Edema: Trace  Mental Status:  Normal mood and affect. Normal behavior. Normal judgment and thought content.   Assessment and Plan:  Pregnancy: G2P0010 at 4261w6d  1. Chronic hypertension during pregnancy, antepartum BP stable but slightly trending up, will plan for IOL 39 weeks NST reactive BPP 10/10  2. Supervision of high risk pregnancy in third trimester  3. Rh negative state in antepartum period   Term labor symptoms and general obstetric precautions  including but not limited to vaginal bleeding, contractions, leaking of fluid and fetal movement were reviewed in detail with the patient. Please refer to After Visit Summary for other counseling recommendations.  Return in about 1 week (around 01/18/2018) for OB visit (MD).   Conan BowensKelly M Jourdin Gens, MD

## 2018-01-12 ENCOUNTER — Telehealth (HOSPITAL_COMMUNITY): Payer: Self-pay | Admitting: *Deleted

## 2018-01-12 NOTE — Telephone Encounter (Signed)
Preadmission screen  

## 2018-01-15 ENCOUNTER — Other Ambulatory Visit: Payer: Self-pay | Admitting: Obstetrics and Gynecology

## 2018-01-16 ENCOUNTER — Encounter: Payer: Self-pay | Admitting: *Deleted

## 2018-01-18 ENCOUNTER — Ambulatory Visit (INDEPENDENT_AMBULATORY_CARE_PROVIDER_SITE_OTHER): Payer: Medicaid Other | Admitting: Obstetrics & Gynecology

## 2018-01-18 ENCOUNTER — Ambulatory Visit (INDEPENDENT_AMBULATORY_CARE_PROVIDER_SITE_OTHER): Payer: Medicaid Other | Admitting: *Deleted

## 2018-01-18 ENCOUNTER — Ambulatory Visit: Payer: Self-pay

## 2018-01-18 VITALS — BP 144/82 | HR 79 | Wt 186.0 lb

## 2018-01-18 DIAGNOSIS — O0993 Supervision of high risk pregnancy, unspecified, third trimester: Secondary | ICD-10-CM

## 2018-01-18 DIAGNOSIS — O10919 Unspecified pre-existing hypertension complicating pregnancy, unspecified trimester: Secondary | ICD-10-CM

## 2018-01-18 DIAGNOSIS — O10913 Unspecified pre-existing hypertension complicating pregnancy, third trimester: Secondary | ICD-10-CM

## 2018-01-18 NOTE — Progress Notes (Signed)
   PRENATAL VISIT NOTE  Subjective:  Arietta Manson PasseyBrown is a 27 y.o. G2P0010 at 3027w6d being seen today for ongoing prenatal care.  She is currently monitored for the following issues for this high-risk pregnancy and has Avitaminosis D; Family history of autism; Supervision of high-risk pregnancy; Chronic hypertension during pregnancy, antepartum; Rh negative state in antepartum period; and Abnormal glucose affecting pregnancy on their problem list.  Patient reports no complaints.  Contractions: Irregular. Vag. Bleeding: None.  Movement: Present. Denies leaking of fluid.   The following portions of the patient's history were reviewed and updated as appropriate: allergies, current medications, past family history, past medical history, past social history, past surgical history and problem list. Problem list updated.  Objective:   Vitals:   01/18/18 1550  BP: (!) 144/82  Pulse: 79  Weight: 186 lb (84.4 kg)    Fetal Status: Fetal Heart Rate (bpm): NST   Movement: Present     General:  Alert, oriented and cooperative. Patient is in no acute distress.  Skin: Skin is warm and dry. No rash noted.   Cardiovascular: Normal heart rate noted  Respiratory: Normal respiratory effort, no problems with respiration noted  Abdomen: Soft, gravid, appropriate for gestational age.  Pain/Pressure: Present     Pelvic: Cervical exam performed        Extremities: Normal range of motion.  Edema: Trace  Mental Status: Normal mood and affect. Normal behavior. Normal judgment and thought content.   Assessment and Plan:  Pregnancy: G2P0010 at 8027w6d  1. Chronic hypertension during pregnancy, antepartum - She is scheduled for IOL this but if her BPP is non reassuring, she will need IOL sooner.  2. Supervision of high risk pregnancy in third trimester   Preterm labor symptoms and general obstetric precautions including but not limited to vaginal bleeding, contractions, leaking of fluid and fetal movement were  reviewed in detail with the patient. Please refer to After Visit Summary for other counseling recommendations.  No follow-ups on file.  Future Appointments  Date Time Provider Department Center  01/20/2018  7:30 AM [facility] RO[facility] None    Allie BossierMyra C Nesbit Michon, MD

## 2018-01-18 NOTE — Progress Notes (Signed)
IOL scheduled 4/5 @ 0730

## 2018-01-18 NOTE — Progress Notes (Signed)

## 2018-01-19 ENCOUNTER — Encounter: Payer: Self-pay | Admitting: Obstetrics and Gynecology

## 2018-01-19 ENCOUNTER — Inpatient Hospital Stay (HOSPITAL_COMMUNITY)
Admission: AD | Admit: 2018-01-19 | Discharge: 2018-01-25 | DRG: 787 | Disposition: A | Discharge status: Home / Self Care | Payer: Medicaid Other | Source: Ambulatory Visit | Attending: Obstetrics & Gynecology | Admitting: Obstetrics & Gynecology

## 2018-01-19 DIAGNOSIS — D62 Acute posthemorrhagic anemia: Secondary | ICD-10-CM

## 2018-01-19 DIAGNOSIS — O26893 Other specified pregnancy related conditions, third trimester: Secondary | ICD-10-CM | POA: Diagnosis present

## 2018-01-19 DIAGNOSIS — Z6791 Unspecified blood type, Rh negative: Secondary | ICD-10-CM

## 2018-01-19 DIAGNOSIS — K567 Ileus, unspecified: Secondary | ICD-10-CM | POA: Diagnosis present

## 2018-01-19 DIAGNOSIS — O1002 Pre-existing essential hypertension complicating childbirth: Secondary | ICD-10-CM | POA: Diagnosis present

## 2018-01-19 DIAGNOSIS — R140 Abdominal distension (gaseous): Secondary | ICD-10-CM

## 2018-01-19 DIAGNOSIS — Z87891 Personal history of nicotine dependence: Secondary | ICD-10-CM

## 2018-01-19 DIAGNOSIS — Z3A39 39 weeks gestation of pregnancy: Secondary | ICD-10-CM | POA: Diagnosis not present

## 2018-01-19 DIAGNOSIS — O9963 Diseases of the digestive system complicating the puerperium: Secondary | ICD-10-CM | POA: Diagnosis present

## 2018-01-19 DIAGNOSIS — O0993 Supervision of high risk pregnancy, unspecified, third trimester: Secondary | ICD-10-CM

## 2018-01-19 DIAGNOSIS — Z98891 History of uterine scar from previous surgery: Secondary | ICD-10-CM

## 2018-01-19 DIAGNOSIS — O9081 Anemia of the puerperium: Secondary | ICD-10-CM | POA: Diagnosis not present

## 2018-01-19 DIAGNOSIS — O10919 Unspecified pre-existing hypertension complicating pregnancy, unspecified trimester: Secondary | ICD-10-CM | POA: Diagnosis present

## 2018-01-19 DIAGNOSIS — O114 Pre-existing hypertension with pre-eclampsia, complicating childbirth: Principal | ICD-10-CM | POA: Diagnosis present

## 2018-01-19 DIAGNOSIS — O134 Gestational [pregnancy-induced] hypertension without significant proteinuria, complicating childbirth: Secondary | ICD-10-CM | POA: Diagnosis not present

## 2018-01-19 DIAGNOSIS — R109 Unspecified abdominal pain: Secondary | ICD-10-CM

## 2018-01-19 DIAGNOSIS — O9989 Other specified diseases and conditions complicating pregnancy, childbirth and the puerperium: Secondary | ICD-10-CM | POA: Diagnosis not present

## 2018-01-19 DIAGNOSIS — O26899 Other specified pregnancy related conditions, unspecified trimester: Secondary | ICD-10-CM

## 2018-01-19 LAB — CBC
HCT: 38.7 % (ref 36.0–46.0)
Hemoglobin: 13.8 g/dL (ref 12.0–15.0)
MCH: 28.2 pg (ref 26.0–34.0)
MCHC: 35.7 g/dL (ref 30.0–36.0)
MCV: 79.1 fL (ref 78.0–100.0)
PLATELETS: 232 10*3/uL (ref 150–400)
RBC: 4.89 MIL/uL (ref 3.87–5.11)
RDW: 14.3 % (ref 11.5–15.5)
WBC: 8.9 10*3/uL (ref 4.0–10.5)

## 2018-01-19 MED ORDER — OXYCODONE-ACETAMINOPHEN 5-325 MG PO TABS: 2.0000 | ORAL_TABLET | ORAL | Status: DC | PRN

## 2018-01-19 MED ORDER — LACTATED RINGERS IV SOLN
500.0000 mL | INTRAVENOUS | Status: DC | PRN
  Administered 2018-01-20 (×3): 500 mL via INTRAVENOUS
  Administered 2018-01-21 (×2): 250 mL via INTRAVENOUS

## 2018-01-19 MED ORDER — FLEET ENEMA 7-19 GM/118ML RE ENEM: 1.0000 | ENEMA | RECTAL | Status: DC | PRN

## 2018-01-19 MED ORDER — LIDOCAINE HCL (PF) 1 % IJ SOLN: 30.0000 mL | INTRAMUSCULAR | Status: DC | PRN

## 2018-01-19 MED ORDER — OXYCODONE-ACETAMINOPHEN 5-325 MG PO TABS: 1.0000 | ORAL_TABLET | ORAL | Status: DC | PRN

## 2018-01-19 MED ORDER — OXYTOCIN 40 UNITS IN LACTATED RINGERS INFUSION - SIMPLE MED
2.5000 [IU]/h | INTRAVENOUS | Status: DC
  Filled 2018-01-19: qty 1000

## 2018-01-19 MED ORDER — ACETAMINOPHEN 325 MG PO TABS: 650.0000 mg | ORAL_TABLET | ORAL | Status: DC | PRN

## 2018-01-19 MED ORDER — LACTATED RINGERS IV SOLN
INTRAVENOUS | Status: DC
  Administered 2018-01-19 – 2018-01-21 (×6): via INTRAVENOUS

## 2018-01-19 MED ORDER — OXYTOCIN BOLUS FROM INFUSION: 500.0000 mL | Freq: Once | INTRAVENOUS | Status: DC

## 2018-01-19 MED ORDER — SOD CITRATE-CITRIC ACID 500-334 MG/5ML PO SOLN
30.0000 mL | ORAL | Status: DC | PRN
  Administered 2018-01-21: 30 mL via ORAL
  Filled 2018-01-19: qty 15

## 2018-01-19 MED ORDER — ONDANSETRON HCL 4 MG/2ML IJ SOLN
4.0000 mg | Freq: Four times a day (QID) | INTRAMUSCULAR | Status: DC | PRN
  Administered 2018-01-21: 4 mg via INTRAVENOUS

## 2018-01-19 MED ORDER — FENTANYL CITRATE (PF) 100 MCG/2ML IJ SOLN
100.0000 ug | INTRAMUSCULAR | Status: DC | PRN
Start: 2018-01-19 — End: 2018-01-21
  Administered 2018-01-20 (×2): 100 ug via INTRAVENOUS
  Filled 2018-01-19 (×3): qty 2

## 2018-01-19 NOTE — Progress Notes (Signed)
I have reviewed this chart and agree with the RN/CMA assessment and management.    Yifan Auker C Jameil Whitmoyer, MD, FACOG Attending Physician, [facility]  

## 2018-01-19 NOTE — MAU Note (Signed)
PT  SAYS  [facility]  HURT BAD  SINCE 6 PM.    DENIES HSV AND MRSA.  [facility]- CLINIC- VE  YESTERDAY  2-3  CM.  GBS-  UNSURE

## 2018-01-20 ENCOUNTER — Other Ambulatory Visit: Payer: Self-pay

## 2018-01-20 ENCOUNTER — Inpatient Hospital Stay (HOSPITAL_COMMUNITY): Payer: Medicaid Other | Admitting: Anesthesiology

## 2018-01-20 ENCOUNTER — Encounter (HOSPITAL_COMMUNITY): Payer: Self-pay

## 2018-01-20 ENCOUNTER — Inpatient Hospital Stay (HOSPITAL_COMMUNITY)
Admission: RE | Admit: 2018-01-20 | Payer: Medicaid Other | Source: Ambulatory Visit | Attending: Obstetrics and Gynecology | Admitting: Obstetrics and Gynecology

## 2018-01-20 LAB — COMPREHENSIVE METABOLIC PANEL
ALT: 27 U/L (ref 14–54)
ANION GAP: 9 (ref 5–15)
AST: 28 U/L (ref 15–41)
Albumin: 2.9 g/dL — ABNORMAL LOW (ref 3.5–5.0)
Alkaline Phosphatase: 220 U/L — ABNORMAL HIGH (ref 38–126)
BUN: 10 mg/dL (ref 6–20)
CALCIUM: 8.8 mg/dL — AB (ref 8.9–10.3)
CO2: 16 mmol/L — AB (ref 22–32)
Chloride: 106 mmol/L (ref 101–111)
Creatinine, Ser: 0.56 mg/dL (ref 0.44–1.00)
GFR calc non Af Amer: >60 mL/min (ref >60)
Glucose, Bld: 87 mg/dL (ref 65–99)
Potassium: 3.9 mmol/L (ref 3.5–5.1)
SODIUM: 131 mmol/L — AB (ref 135–145)
Total Bilirubin: 0.5 mg/dL (ref 0.3–1.2)
Total Protein: 7.1 g/dL (ref 6.5–8.1)

## 2018-01-20 LAB — CBC WITH DIFFERENTIAL/PLATELET
BASOS ABS: 0 10*3/uL (ref 0.0–0.1)
Basophils Relative: 0 %
Eosinophils Absolute: 0.1 10*3/uL (ref 0.0–0.7)
Eosinophils Relative: 1 %
HEMATOCRIT: 38.8 % (ref 36.0–46.0)
Hemoglobin: 13.5 g/dL (ref 12.0–15.0)
LYMPHS ABS: 1.9 10*3/uL (ref 0.7–4.0)
LYMPHS PCT: 20 %
MCH: 27.8 pg (ref 26.0–34.0)
MCHC: 34.8 g/dL (ref 30.0–36.0)
MCV: 80 fL (ref 78.0–100.0)
MONO ABS: 0.6 10*3/uL (ref 0.1–1.0)
MONOS PCT: 6 %
NEUTROS ABS: 7 10*3/uL (ref 1.7–7.7)
Neutrophils Relative %: 73 %
Platelets: 227 10*3/uL (ref 150–400)
RBC: 4.85 MIL/uL (ref 3.87–5.11)
RDW: 14.4 % (ref 11.5–15.5)
WBC: 9.5 10*3/uL (ref 4.0–10.5)

## 2018-01-20 LAB — PROTEIN / CREATININE RATIO, URINE
CREATININE, URINE: 65 mg/dL
Protein Creatinine Ratio: 0.6 mg/mg{Cre} — ABNORMAL HIGH (ref 0.00–0.15)
TOTAL PROTEIN, URINE: 39 mg/dL

## 2018-01-20 LAB — RPR: RPR Ser Ql: NONREACTIVE

## 2018-01-20 MED ORDER — EPHEDRINE 5 MG/ML INJ
10.0000 mg | INTRAVENOUS | Status: DC | PRN
  Administered 2018-01-21: 10 mg via INTRAVENOUS

## 2018-01-20 MED ORDER — MISOPROSTOL 50MCG HALF TABLET
50.0000 ug | ORAL_TABLET | ORAL | Status: DC | PRN
  Administered 2018-01-20: 50 ug via BUCCAL
  Filled 2018-01-20: qty 1

## 2018-01-20 MED ORDER — EPHEDRINE 5 MG/ML INJ
10.0000 mg | INTRAVENOUS | Status: DC | PRN
  Filled 2018-01-20: qty 4

## 2018-01-20 MED ORDER — DIPHENHYDRAMINE HCL 50 MG/ML IJ SOLN: 12.5000 mg | INTRAMUSCULAR | Status: DC | PRN

## 2018-01-20 MED ORDER — FENTANYL 2.5 MCG/ML BUPIVACAINE 1/10 % EPIDURAL INFUSION (WH - ANES)
14.0000 mL/h | INTRAMUSCULAR | Status: DC | PRN
  Administered 2018-01-20 (×3): 14 mL/h via EPIDURAL
  Filled 2018-01-20 (×3): qty 100

## 2018-01-20 MED ORDER — PHENYLEPHRINE 40 MCG/ML (10ML) SYRINGE FOR IV PUSH (FOR BLOOD PRESSURE SUPPORT)
80.0000 ug | PREFILLED_SYRINGE | INTRAVENOUS | Status: DC | PRN
  Administered 2018-01-21: 80 ug via INTRAVENOUS

## 2018-01-20 MED ORDER — LACTATED RINGERS IV SOLN
500.0000 mL | Freq: Once | INTRAVENOUS | Status: AC
  Administered 2018-01-20: 500 mL via INTRAVENOUS

## 2018-01-20 MED ORDER — TERBUTALINE SULFATE 1 MG/ML IJ SOLN
0.2500 mg | Freq: Once | INTRAMUSCULAR | Status: AC | PRN
  Administered 2018-01-20: 0.25 mg via SUBCUTANEOUS

## 2018-01-20 MED ORDER — TERBUTALINE SULFATE 1 MG/ML IJ SOLN
0.2500 mg | Freq: Once | INTRAMUSCULAR | Status: AC | PRN
  Administered 2018-01-21: 0.25 mg via SUBCUTANEOUS
  Filled 2018-01-20: qty 1

## 2018-01-20 MED ORDER — PHENYLEPHRINE 40 MCG/ML (10ML) SYRINGE FOR IV PUSH (FOR BLOOD PRESSURE SUPPORT)
80.0000 ug | PREFILLED_SYRINGE | INTRAVENOUS | Status: DC | PRN
  Filled 2018-01-20 (×2): qty 10

## 2018-01-20 MED ORDER — OXYTOCIN 40 UNITS IN LACTATED RINGERS INFUSION - SIMPLE MED
1.0000 m[IU]/min | INTRAVENOUS | Status: DC
  Administered 2018-01-20: 2 m[IU]/min via INTRAVENOUS
  Administered 2018-01-20: 4 m[IU]/min via INTRAVENOUS

## 2018-01-20 MED ORDER — LIDOCAINE HCL (PF) 1 % IJ SOLN
INTRAMUSCULAR | Status: DC | PRN
  Administered 2018-01-20: 6 mL
  Administered 2018-01-20: 6 mL via EPIDURAL

## 2018-01-20 NOTE — Anesthesia Pain Management Evaluation Note (Signed)
  CRNA Pain Management Visit Note  Patient: Brittany Long, 27 y.o., female  "Hello I am a member of the anesthesia team at [facility]. We have an anesthesia team available at all times to provide care throughout the hospital, including epidural management and anesthesia for C-section. I don't know your plan for the delivery whether it a natural birth, water birth, IV sedation, nitrous supplementation, doula or epidural, but we want to meet your pain goals."   1.Was your pain managed to your expectations on prior hospitalizations?   Yes   2.What is your expectation for pain management during this hospitalization?     Epidural  3.How can we help you reach that goal? epidural  Record the patient's initial score and the patient's pain goal.   Pain: 9  Pain Goal: 9 [facility] wants you to be able to say your pain was always managed very well.  Majd Tissue 01/20/2018

## 2018-01-20 NOTE — Anesthesia Procedure Notes (Signed)
Epidural Patient location during procedure: OB Start time: 01/20/2018 8:44 AM End time: 01/20/2018 8:47 AM  Staffing Anesthesiologist: Leilani AbleHatchett, Crit Obremski, MD  Preanesthetic Checklist Completed: patient identified, site marked, surgical consent, pre-op evaluation, timeout performed, IV checked, risks and benefits discussed and monitors and equipment checked  Epidural Patient position: sitting Prep: site prepped and draped and DuraPrep Patient monitoring: continuous pulse ox and blood pressure Approach: midline Location: L3-L4 Injection technique: LOR air  Needle:  Needle type: Tuohy  Needle gauge: 17 G Needle length: 9 cm and 9 Needle insertion depth: 6 cm Catheter type: closed end flexible Catheter size: 19 Gauge Catheter at skin depth: 11 cm Test dose: negative and Other  Assessment Sensory level: T8 Events: blood not aspirated, injection not painful, no injection resistance, negative IV test and no paresthesia  Additional Notes Reason for block:procedure for pain

## 2018-01-20 NOTE — Progress Notes (Signed)
Labor Progress Note Brittany Long is a 27 y.o. G2P0010 at 5572w1d presented for IOL for CHTN with super imposed preeclampsia  S:  Patient sleeping  O:  BP (!) 150/91   Pulse 92   Temp 98 F (36.7 C) (Oral)   Resp 19   Ht 5' 4.5" (1.638 m)   Wt 186 lb 1.9 oz (84.4 kg)   LMP 04/21/2017   SpO2 100%   BMI 31.45 kg/m   Fetal Tracing:  Baseline: 130 Variability: moderate Accels: 15x15 Decels: none  Toco: 1-9  CVE: Dilation: 3 Effacement (%): 80 Station: -2 Presentation: Vertex Exam by:: C Katerina Zurn CNM   A&P: 27 y.o. G2P0010 1672w1d IOL for CHTN with super imposed preeclampsia  #Labor: Discussed with patient risks and benefits of pitocin for augmentation. Patient agreeable to plan of care. Will start pitocin 2x2 #Pain: epidural #FWB: Cat 1 #GBS negative  [name], CNM 11:06 AM

## 2018-01-20 NOTE — Progress Notes (Signed)
LABOR PROGRESS NOTE  Subjective:  Patient seen and examined for progress of labor. Patient comfortable with epidural. Feeling contractions without change of frequency or intensity since AROM. Would like to use peanut.  Objective:  Vitals:   01/20/18 2050 01/20/18 2055 01/20/18 2100 01/20/18 2101  BP:    132/61  Pulse:    82  Resp:      Temp:      TempSrc:      SpO2: 100% 100% 100%   Weight:      Height:       Dilation: 3 Effacement (%): 80 Cervical Position: Middle Station: -2 Presentation: Vertex Exam by:: Lahoma Crockeraylor Summers, RN  FHT: 120 bpm, moderate variability, accelerations present, intermittent variable decelerations not with each contraction TOCO: regular, every 3-4 minutes  Assessment/Plan: 27 y.o. G2P0010 2867w1d IOL forcHTN w/SIPE. S/p cytotec x1, AROM @1755 , with IUPC and FSE. She has been having intermittent variables but not with each contraction. Suspect this may be due to chord wrapped around an extremity. Fetal strip reassuring following Trendelenburg and 500 mL NS bolus. Will continue on pit at present and titrate to achieve adequate labor.  Labor: stage 1 Preeclampsia: not on IV mag given asymptomatic mild severity, BP 130/60s Fetal wellbeing: category 2 Pain control: epidural I/D: neg Anticipated MOD: continue expectant management, anticipate SVD  Durward Parcelavid McMullen, DO, PGY-2 01/20/2018, 9:22 PM

## 2018-01-20 NOTE — Progress Notes (Signed)
Labor Progress Note Brittany Long is a 27 y.o. G2P0010 at 3099w1d presented for IOL forCHTN with super imposed preeclampsia  S:  Patient sleeping  O:  BP 133/77   Pulse 85   Temp 98.5 F (36.9 C) (Oral)   Resp 18   Ht 5' 4.5" (1.638 m)   Wt 186 lb 1.9 oz (84.4 kg)   LMP 04/21/2017   SpO2 100%   BMI 31.45 kg/m   Fetal Tracing:  Baseline: 140 Variability: moderate Accels: 10x10 Decels: none  Toco: 1-2  CVE: Dilation: 3 Effacement (%): 80 Station: -2 Presentation: Vertex Exam by:: Druscilla BrownieNeill, CNM   A&P: [age] y.o. G2P0010 4299w1d IOL forCHTN with super imposed preeclampsia  #Labor: AROM with a small amount of meconium stained fluid. IUPC placed without difficulty.  CNM called back to bedside after AROM for FHR deceleration. Pitocin stopped, terbutaline given. FSE placed, position changed and O2 applied. Dr. Despina HiddenEure called to bedside. Deceleration lasted 6 minutes and recovered to baseline. Will monitor FHR for next hour and if remains reassuring, will restart pitocin at 765mu/min.  #Pain: epidural #FWB: Cat 2 #GBS negative  Brittany Long, CNM 6:20 PM

## 2018-01-20 NOTE — Progress Notes (Signed)
LABOR PROGRESS NOTE  Subjective:  Patient seen and examined for progress of labor. Patient remains comfortable and denies headache or change in vision.   Objective:  Vitals:   01/20/18 0129 01/20/18 0204 01/20/18 0244 01/20/18 0328  BP: (!) 127/109 (!) 136/55 140/68 (!) 148/67  Pulse: 63 90 76 77  Resp: 18 20 20 18   Temp: 98.7 F (37.1 C)     TempSrc: Oral     Weight:      Height:       Dilation: 1.5 Effacement (%): 80 Station: -1 Presentation: Vertex Exam by:: C Fisher RN FHT: 145 bpm, minimal variability, accelerations present, absent decelerations TOCO: irregular, every 2-3 minutes  Assessment/Plan: Brittany Long is a 27 y.o. G2P0010 with an IUP at 10946w1d presenting for IOL for CHTN, no meds, but worsening BPs.  Labor: stage 1 Preeclampsia: mild, BP well controlled 130/70s, asymptomatic Fetal wellbeing: category 1 Pain control: IV pain meds PRN, planning epidural I/D: neg Anticipated MOD: continue expectant management, anticipate SVD  Durward Parcelavid McMullen, DO, PGY-2 01/20/2018, 5:08 AM

## 2018-01-20 NOTE — Progress Notes (Signed)
RN to perform cervical exam on pt. If pt has made cervical change, RN not to restart pitocin. If no cervical change made, RN to restart pitocin at 5 milliunits/minute

## 2018-01-20 NOTE — H&P (Signed)
Brittany Long is a 27 y.o. female G2P0010 with IUP at [redacted]w[redacted]d presenting for IOL (10 hours before scheduled IOL) for [facility].  (no meds)  She came to MAU w/c.o ctx q 4-5 minutes, getting stronger.  BPs were .155/85 and 150/83, so decision was made to keep her and augment if necessary.    Prenatal History/Complications: CHTN    Past Medical History: Past Medical History:  Diagnosis Date  . Hypertension     Past Surgical History: Past Surgical History:  Procedure Laterality Date  . NO PAST SURGERIES      Obstetrical History: OB History    Gravida  2   Para      Term      Preterm      AB  1   Living  0     SAB  1   TAB      Ectopic      Multiple      Live Births              Social History: Social History   Socioeconomic History  . Marital status: Single    Spouse name: N/A  . Number of children: 0  . Years of education: Not on file  . Highest education level: Not on file  Occupational History  . Occupation: Occupational psychologist    Comment: [employer]  Social Needs  . Financial resource strain: Not on file  . Food insecurity:    Worry: Not on file    Inability: Not on file  . Transportation needs:    Medical: Not on file    Non-medical: Not on file  Tobacco Use  . Smoking status: Former Smoker    Years: 1.00    Types: Cigars    Last attempt to quit: 06/02/2017    Years since quitting: 0.6  . Smokeless tobacco: Never Used  . Tobacco comment: 1 black & mild daily  Substance and Sexual Activity  . Alcohol use: No    Alcohol/week: 0.0 oz    Frequency: Never    Comment: every now and then, on the weekend  . Drug use: No  . Sexual activity: Yes    Partners: Male    Birth control/protection: None  Lifestyle  . Physical activity:    Days per week: Not on file    Minutes per session: Not on file  . Stress: Not on file  Relationships  . Social connections:    Talks on phone: Not on file    Gets together: Not on file    Attends religious service: Not  on file    Active member of club or organization: Not on file    Attends meetings of clubs or organizations: Not on file    Relationship status: Not on file  Other Topics Concern  . Not on file  Social History Narrative   Deploys 10/2014 with [employer] to [employer], [place]. 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   Mother is in [place], [place]. Other family in [place].   Attended [employer] studying social work.    Family History: History reviewed. No pertinent family history.  Allergies: No Known Allergies  Medications Prior to Admission  Medication Sig Dispense Refill Last Dose  . acetaminophen (TYLENOL) 500 MG tablet Take 1,000 mg by mouth every 6 (six) hours as needed for mild pain.   Taking  . Prenatal Vit-Fe Fumarate-FA (PREPLUS) 27-1 MG TABS Take by mouth.   Not Taking  Review of Systems   Constitutional: Negative for fever and chills Eyes: Negative for visual disturbances Respiratory: Negative for shortness of breath, dyspnea Cardiovascular: Negative for chest pain or palpitations  Gastrointestinal: Negative for abdominal pain, vomiting, diarrhea and constipation.   Genitourinary: Negative for dysuria and urgency Musculoskeletal: Negative for back pain, joint pain, myalgias  Neurological: Negative for dizziness and headaches      Blood pressure (!) 146/82, pulse 84, temperature 98.5 F (36.9 C), temperature source Oral, resp. rate 18, height 5' 4.5" (1.638 m), weight 84.4 kg (186 lb 1.9 oz), last menstrual period 04/21/2017. General appearance: alert, cooperative and no distress Lungs: clear to auscultation bilaterally Heart: regular rate and rhythm Abdomen: soft, non-tender; bowel sounds normal Extremities: Homans sign is negative, no sign of DVT DTR's 2+ Presentation: cephalic Fetal monitoring  Baseline: 140 bpm, Variability: Good {> 6 bpm), Accelerations: Reactive and Decelerations: Absent Uterine activity   2-4 minutes, getting stronger per pt  Dilation: 1.5 Effacement (%): 90 Station: -1, -2 Exam by:: Latricia HeftAnna Cioce, RN   Prenatal labs: ABO, Rh: --/--/O NEG (04/04 2323) Antibody: POS (04/04 2323) Rubella: immune RPR: Non Reactive (02/15 1354)  HBsAg: Negative (10/18 0000)  HIV: Non Reactive (02/15 1354)    Prenatal Transfer Tool  Maternal Diabetes: No Genetic Screening: Normal Maternal Ultrasounds/Referrals: Normal Fetal Ultrasounds or other Referrals:  None Maternal Substance Abuse:  No Significant Maternal Medications:  None Significant Maternal Lab Results: None    Results for orders placed or performed during the hospital encounter of 01/19/18 (from the past 24 hour(s))  Protein / creatinine ratio, urine   Collection Time: 01/19/18 10:07 PM  Result Value Ref Range   Creatinine, Urine 65.00 mg/dL   Total Protein, Urine 39 mg/dL   Protein Creatinine Ratio 0.60 (H) 0.00 - 0.15 mg/mg[Cre]  CBC   Collection Time: 01/19/18 11:23 PM  Result Value Ref Range   WBC 8.9 4.0 - 10.5 K/uL   RBC 4.89 3.87 - 5.11 MIL/uL   Hemoglobin 13.8 12.0 - 15.0 g/dL   HCT 16.138.7 09.636.0 - 04.546.0 %   MCV 79.1 78.0 - 100.0 fL   MCH 28.2 26.0 - 34.0 pg   MCHC 35.7 30.0 - 36.0 g/dL   RDW 40.914.3 81.111.5 - 91.415.5 %   Platelets 232 150 - 400 K/uL  Type and screen   Collection Time: 01/19/18 11:23 PM  Result Value Ref Range   ABO/RH(D) O NEG    Antibody Screen POS    Sample Expiration      01/22/2018 Performed at [facility], [address] [place], [place] [id number]    Antibody Identification PENDING   Comprehensive metabolic panel   Collection Time: 01/20/18 12:03 AM  Result Value Ref Range   Sodium 131 (L) 135 - 145 mmol/L   Potassium 3.9 3.5 - 5.1 mmol/L   Chloride 106 101 - 111 mmol/L   CO2 16 (L) 22 - 32 mmol/L   Glucose, Bld 87 65 - 99 mg/dL   BUN 10 6 - 20 mg/dL   Creatinine, Ser 6.210.56 0.44 - 1.00 mg/dL   Calcium 8.8 (L) 8.9 - 10.3 mg/dL   Total Protein 7.1 6.5 - 8.1 g/dL   Albumin 2.9  (L) 3.5 - 5.0 g/dL   AST 28 15 - 41 U/L   ALT 27 14 - 54 U/L   Alkaline Phosphatase 220 (H) 38 - 126 U/L   Total Bilirubin 0.5 0.3 - 1.2 mg/dL   GFR calc non Af Amer >60 >60 mL/min  GFR calc Af Amer >60 >60 mL/min   Anion gap 9 5 - 15   Clinic  [facility] (transferred from [facility]) Prenatal Labs  Dating LMP c/w ultrasound Blood type: --/--/O NEG (10/11 1230)   Genetic Screen Quad:neg Antibody:NEG (10/11 1230)  Anatomic [place]  Normal Rubella:  Immune  GTT Early: nl 3hr              Third trimester:  RPR:   NR  Flu vaccine 08/04/17 HBsAg:   Neg  TDaP vaccine  1/31/1                        Rhogam: 11/17/17 HIV:  NR  Baby Food Breast                                     GBS: (For PCN allergy, check sensitivities)negative  Contraception Undecided- ocps? Pap: neg (08/04/17)  Circumcision Yes, inpatient   Pediatrician Given list CF: negative  Support Person Brittany Long (FOB) SMA  Prenatal Classes  Hgb electrophoresis:sickle cell screen normal    Assessment: Brittany Long is a 27 y.o. G2P0010 with an IUP at [redacted]w[redacted]d presenting for IOL for CHTN, no meds, but worsening BPs.  Plan: #Labor: D/t frequent ctx that pt states are getting strnger, will hold off on cytotec for now #Pain:  Per request #FWB Cat 1  Jacklyn Shell 01/20/2018, 1:23 AM

## 2018-01-20 NOTE — Progress Notes (Signed)
Per Druscilla BrownieNeill, CNM, RN to remove oxygen in 15-20 minutes and call to restart pitocin around 1930.

## 2018-01-20 NOTE — Anesthesia Preprocedure Evaluation (Signed)
Anesthesia Evaluation  Patient identified by MRN, date of birth, ID band Patient awake    Reviewed: Allergy & Precautions, NPO status , Patient's Chart, lab work & pertinent test results  Airway Mallampati: I       Dental no notable dental hx. (+) Teeth Intact   Pulmonary former smoker,    Pulmonary exam normal breath sounds clear to auscultation       Cardiovascular hypertension,  Rhythm:Regular Rate:Normal     Neuro/Psych negative neurological ROS  negative psych ROS   GI/Hepatic negative GI ROS, Neg liver ROS,   Endo/Other  negative endocrine ROS  Renal/GU negative Renal ROS  negative genitourinary   Musculoskeletal negative musculoskeletal ROS (+)   Abdominal Normal abdominal exam  (+)   Peds  Hematology negative hematology ROS (+)   Anesthesia Other Findings   Reproductive/Obstetrics                             Anesthesia Physical Anesthesia Plan  ASA: II  Anesthesia Plan: Epidural   Post-op Pain Management:    Induction:   PONV Risk Score and Plan:   Airway Management Planned:   Additional Equipment:   Intra-op Plan:   Post-operative Plan:   Informed Consent: I have reviewed the patients History and Physical, chart, labs and discussed the procedure including the risks, benefits and alternatives for the proposed anesthesia with the patient or authorized representative who has indicated his/her understanding and acceptance.     Plan Discussed with:   Anesthesia Plan Comments:         Anesthesia Quick Evaluation

## 2018-01-20 NOTE — Progress Notes (Signed)
Labor Progress Note Brittany Long is a 27 y.o. G2P0010 at 4736w1d [facility] imposed pre[facility]  S:  Patient comfortable with epidural. Denies any headache, visual changes or epigastric pain.  O:  BP 137/76   Pulse 63   Temp (!) 97.4 F (36.3 C) (Oral)   Resp 19   Ht 5' 4.5" (1.638 m)   Wt 186 lb 1.9 oz (84.4 kg)   LMP 04/21/2017   SpO2 100%   BMI 31.45 kg/m   Fetal Tracing:  Baseline: 140 Variability: mdoerate Accels: 15x15 Decels: none  Toco: 1-3   CVE: Dilation: 3 Effacement (%): 90 Station: -2 Presentation: Vertex Exam by:: RS   A&P: 27 y.o. G2P0010 5536w1d IOL for CHTN with super imposed preeclampsia #Labor: Progressing well. Contracting too much for more cytotec and changing cervix. Will recheck in 1-2 hours and consider pitocin if no change. #Pain: epidural #FWB: Cat 1 #GBS negative  Rolm Bookbinderaroline M Neill, CNM 9:29 AM

## 2018-01-20 NOTE — Progress Notes (Signed)
Labor Progress Note Joniyah Manson PasseyBrown is a 27 y.o. G2P0010 at 4278w1d presented for IOL for CHTN with super imposed preeclampsia  S:  Patient comfortable with epidural  O:  BP (!) 149/77   Pulse 96   Temp 98 F (36.7 C) (Oral)   Resp 18   Ht 5' 4.5" (1.638 m)   Wt 186 lb 1.9 oz (84.4 kg)   LMP 04/21/2017   SpO2 100%   BMI 31.45 kg/m   Fetal Tracing:  Baseline: 140 Variability: moderate Accels: 10x10 Decels: none  Toco: 2-4  CVE: Dilation: 3 Effacement (%): 80 Station: -2 Presentation: Vertex Exam by:: Druscilla BrownieNeill CNM   A&P: 27 y.o. G2P0010 3078w1d IOL for CHTN with super imposed preeclampsia  #Labor: Continue pitocin. BBOW noted.  #Pain: epidural #FWB: Cat 1 #GBS negative  Rolm Bookbinderaroline M Neill, CNM 1:58 PM

## 2018-01-21 ENCOUNTER — Encounter (HOSPITAL_COMMUNITY): Payer: Self-pay

## 2018-01-21 ENCOUNTER — Encounter (HOSPITAL_COMMUNITY)
Admission: AD | Disposition: A | Discharge status: Home / Self Care | Payer: Self-pay | Source: Ambulatory Visit | Attending: Obstetrics & Gynecology

## 2018-01-21 DIAGNOSIS — Z3A39 39 weeks gestation of pregnancy: Secondary | ICD-10-CM

## 2018-01-21 DIAGNOSIS — O134 Gestational [pregnancy-induced] hypertension without significant proteinuria, complicating childbirth: Secondary | ICD-10-CM

## 2018-01-21 DIAGNOSIS — O9989 Other specified diseases and conditions complicating pregnancy, childbirth and the puerperium: Secondary | ICD-10-CM

## 2018-01-21 LAB — CBC
HCT: 35.9 % — ABNORMAL LOW (ref 36.0–46.0)
HCT: 35.9 % — ABNORMAL LOW (ref 36.0–46.0)
Hemoglobin: 12.4 g/dL (ref 12.0–15.0)
Hemoglobin: 12.4 g/dL (ref 12.0–15.0)
MCH: 27.5 pg (ref 26.0–34.0)
MCH: 27.7 pg (ref 26.0–34.0)
MCHC: 34.5 g/dL (ref 30.0–36.0)
MCHC: 34.5 g/dL (ref 30.0–36.0)
MCV: 79.6 fL (ref 78.0–100.0)
MCV: 80.3 fL (ref 78.0–100.0)
PLATELETS: 163 10*3/uL (ref 150–400)
Platelets: 189 10*3/uL (ref 150–400)
RBC: 4.51 MIL/uL (ref 3.87–5.11)
RBC: 4.47 MIL/uL (ref 3.87–5.11)
RDW: 14.6 % (ref 11.5–15.5)
RDW: 14.6 % (ref 11.5–15.5)
WBC: 11.3 10*3/uL — ABNORMAL HIGH (ref 4.0–10.5)
WBC: 12 10*3/uL — ABNORMAL HIGH (ref 4.0–10.5)

## 2018-01-21 SURGERY — Surgical Case
Anesthesia: Epidural

## 2018-01-21 MED ORDER — METHYLERGONOVINE MALEATE 0.2 MG/ML IJ SOLN: 0.2000 mg | INTRAMUSCULAR | Status: DC | PRN

## 2018-01-21 MED ORDER — SODIUM BICARBONATE 8.4 % IV SOLN
INTRAVENOUS | Status: AC
  Filled 2018-01-21: qty 50

## 2018-01-21 MED ORDER — NALOXONE HCL 0.4 MG/ML IJ SOLN: 0.4000 mg | INTRAMUSCULAR | Status: DC | PRN

## 2018-01-21 MED ORDER — OXYCODONE HCL 5 MG/5ML PO SOLN: 5.0000 mg | Freq: Once | ORAL | Status: DC | PRN

## 2018-01-21 MED ORDER — KETOROLAC TROMETHAMINE 30 MG/ML IJ SOLN: 30.0000 mg | Freq: Four times a day (QID) | INTRAMUSCULAR | Status: AC | PRN

## 2018-01-21 MED ORDER — PRENATAL MULTIVITAMIN CH
1.0000 | ORAL_TABLET | Freq: Every day | ORAL | Status: DC
Start: 2018-01-21 — End: 2018-01-25
  Administered 2018-01-21 – 2018-01-23 (×3): 1 via ORAL
  Filled 2018-01-21 (×3): qty 1

## 2018-01-21 MED ORDER — ONDANSETRON HCL 4 MG/2ML IJ SOLN: 4.0000 mg | Freq: Four times a day (QID) | INTRAMUSCULAR | Status: DC | PRN

## 2018-01-21 MED ORDER — ONDANSETRON HCL 4 MG/2ML IJ SOLN: 4.0000 mg | Freq: Three times a day (TID) | INTRAMUSCULAR | Status: DC | PRN

## 2018-01-21 MED ORDER — FENTANYL CITRATE (PF) 100 MCG/2ML IJ SOLN
INTRAMUSCULAR | Status: AC
  Administered 2018-01-21: 50 ug via INTRAVENOUS
  Filled 2018-01-21: qty 2

## 2018-01-21 MED ORDER — METHYLERGONOVINE MALEATE 0.2 MG/ML IJ SOLN
INTRAMUSCULAR | Status: AC
  Filled 2018-01-21: qty 1

## 2018-01-21 MED ORDER — SODIUM CHLORIDE 0.9 % IR SOLN
Status: DC | PRN
  Administered 2018-01-21: 1

## 2018-01-21 MED ORDER — DIPHENHYDRAMINE HCL 25 MG PO CAPS: 25.0000 mg | ORAL_CAPSULE | Freq: Four times a day (QID) | ORAL | Status: DC | PRN

## 2018-01-21 MED ORDER — MEPERIDINE HCL 25 MG/ML IJ SOLN: 6.2500 mg | INTRAMUSCULAR | Status: DC | PRN

## 2018-01-21 MED ORDER — ZOLPIDEM TARTRATE 5 MG PO TABS: 5.0000 mg | ORAL_TABLET | Freq: Every evening | ORAL | Status: DC | PRN

## 2018-01-21 MED ORDER — FENTANYL CITRATE (PF) 100 MCG/2ML IJ SOLN
25.0000 ug | INTRAMUSCULAR | Status: DC | PRN
  Administered 2018-01-21 (×2): 50 ug via INTRAVENOUS
  Administered 2018-01-21: 25 ug via INTRAVENOUS
  Administered 2018-01-21: 50 ug via INTRAVENOUS

## 2018-01-21 MED ORDER — MORPHINE SULFATE (PF) 0.5 MG/ML IJ SOLN
INTRAMUSCULAR | Status: AC
  Filled 2018-01-21: qty 10

## 2018-01-21 MED ORDER — MORPHINE SULFATE (PF) 0.5 MG/ML IJ SOLN
INTRAMUSCULAR | Status: DC | PRN
  Administered 2018-01-21: 4 mg via EPIDURAL

## 2018-01-21 MED ORDER — SIMETHICONE 80 MG PO CHEW
80.0000 mg | CHEWABLE_TABLET | ORAL | Status: DC
  Administered 2018-01-21: 80 mg via ORAL
  Filled 2018-01-21: qty 1

## 2018-01-21 MED ORDER — CEFAZOLIN SODIUM-DEXTROSE 2-4 GM/100ML-% IV SOLN
INTRAVENOUS | Status: AC
Start: 2018-01-21 — End: 2018-01-21
  Filled 2018-01-21: qty 100

## 2018-01-21 MED ORDER — CEFAZOLIN SODIUM-DEXTROSE 2-3 GM-%(50ML) IV SOLR
INTRAVENOUS | Status: DC | PRN
  Administered 2018-01-21: 2 g via INTRAVENOUS

## 2018-01-21 MED ORDER — DIBUCAINE 1 % RE OINT: 1.0000 "application " | TOPICAL_OINTMENT | RECTAL | Status: DC | PRN

## 2018-01-21 MED ORDER — WITCH HAZEL-GLYCERIN EX PADS: 1.0000 "application " | MEDICATED_PAD | CUTANEOUS | Status: DC | PRN

## 2018-01-21 MED ORDER — IBUPROFEN 600 MG PO TABS
600.0000 mg | ORAL_TABLET | Freq: Four times a day (QID) | ORAL | Status: DC
  Administered 2018-01-21 – 2018-01-25 (×17): 600 mg via ORAL
  Filled 2018-01-21 (×17): qty 1

## 2018-01-21 MED ORDER — MENTHOL 3 MG MT LOZG: 1.0000 | LOZENGE | OROMUCOSAL | Status: DC | PRN

## 2018-01-21 MED ORDER — SODIUM CHLORIDE 0.9% FLUSH: 3.0000 mL | INTRAVENOUS | Status: DC | PRN

## 2018-01-21 MED ORDER — ONDANSETRON HCL 4 MG/2ML IJ SOLN
INTRAMUSCULAR | Status: AC
  Filled 2018-01-21: qty 2

## 2018-01-21 MED ORDER — SIMETHICONE 80 MG PO CHEW
80.0000 mg | CHEWABLE_TABLET | Freq: Three times a day (TID) | ORAL | Status: DC
  Administered 2018-01-21 – 2018-01-25 (×12): 80 mg via ORAL
  Filled 2018-01-21 (×11): qty 1

## 2018-01-21 MED ORDER — METHYLERGONOVINE MALEATE 0.2 MG PO TABS: 0.2000 mg | ORAL_TABLET | ORAL | Status: DC | PRN

## 2018-01-21 MED ORDER — LIDOCAINE-EPINEPHRINE (PF) 2 %-1:200000 IJ SOLN
INTRAMUSCULAR | Status: DC | PRN
  Administered 2018-01-21: 5 mL via EPIDURAL
  Administered 2018-01-21: 10 mL via EPIDURAL

## 2018-01-21 MED ORDER — SENNOSIDES-DOCUSATE SODIUM 8.6-50 MG PO TABS
2.0000 | ORAL_TABLET | ORAL | Status: DC
Start: 2018-01-22 — End: 2018-01-25
  Administered 2018-01-21 – 2018-01-24 (×4): 2 via ORAL
  Filled 2018-01-21 (×4): qty 2

## 2018-01-21 MED ORDER — COCONUT OIL OIL
1.0000 "application " | TOPICAL_OIL | Status: DC | PRN
  Filled 2018-01-21: qty 120

## 2018-01-21 MED ORDER — BUPIVACAINE HCL (PF) 0.25 % IJ SOLN
INTRAMUSCULAR | Status: DC | PRN
  Administered 2018-01-21 (×2): 4 mL via EPIDURAL

## 2018-01-21 MED ORDER — OXYTOCIN 10 UNIT/ML IJ SOLN
INTRAMUSCULAR | Status: AC
  Filled 2018-01-21: qty 4

## 2018-01-21 MED ORDER — METHYLERGONOVINE MALEATE 0.2 MG/ML IJ SOLN
INTRAMUSCULAR | Status: DC | PRN
  Administered 2018-01-21: 0.2 mg via INTRAMUSCULAR

## 2018-01-21 MED ORDER — MEPERIDINE HCL 25 MG/ML IJ SOLN
INTRAMUSCULAR | Status: AC
  Filled 2018-01-21: qty 1

## 2018-01-21 MED ORDER — SCOPOLAMINE 1 MG/3DAYS TD PT72
MEDICATED_PATCH | TRANSDERMAL | Status: AC
  Filled 2018-01-21: qty 1

## 2018-01-21 MED ORDER — SCOPOLAMINE 1 MG/3DAYS TD PT72
MEDICATED_PATCH | TRANSDERMAL | Status: DC | PRN
  Administered 2018-01-21: 1 via TRANSDERMAL

## 2018-01-21 MED ORDER — LACTATED RINGERS IV SOLN
INTRAVENOUS | Status: DC | PRN
  Administered 2018-01-21: 03:00:00 via INTRAVENOUS

## 2018-01-21 MED ORDER — OXYCODONE HCL 5 MG PO TABS: 5.0000 mg | ORAL_TABLET | Freq: Once | ORAL | Status: DC | PRN

## 2018-01-21 MED ORDER — ACETAMINOPHEN 325 MG PO TABS
650.0000 mg | ORAL_TABLET | ORAL | Status: DC | PRN
  Administered 2018-01-24 – 2018-01-25 (×2): 650 mg via ORAL
  Filled 2018-01-21 (×3): qty 2

## 2018-01-21 MED ORDER — OXYCODONE-ACETAMINOPHEN 5-325 MG PO TABS
2.0000 | ORAL_TABLET | ORAL | Status: DC | PRN
  Administered 2018-01-22 – 2018-01-23 (×4): 2 via ORAL
  Filled 2018-01-21 (×4): qty 2

## 2018-01-21 MED ORDER — OXYCODONE-ACETAMINOPHEN 5-325 MG PO TABS
1.0000 | ORAL_TABLET | ORAL | Status: DC | PRN
  Administered 2018-01-21: 1 via ORAL
  Filled 2018-01-21 (×2): qty 1

## 2018-01-21 MED ORDER — FENTANYL CITRATE (PF) 100 MCG/2ML IJ SOLN
INTRAMUSCULAR | Status: DC | PRN
  Administered 2018-01-21 (×2): 50 ug via EPIDURAL

## 2018-01-21 MED ORDER — SIMETHICONE 80 MG PO CHEW: 80.0000 mg | CHEWABLE_TABLET | ORAL | Status: DC | PRN

## 2018-01-21 MED ORDER — OXYTOCIN 10 UNIT/ML IJ SOLN
INTRAVENOUS | Status: DC | PRN
  Administered 2018-01-21: 40 [IU] via INTRAVENOUS

## 2018-01-21 MED ORDER — LIDOCAINE-EPINEPHRINE (PF) 2 %-1:200000 IJ SOLN
INTRAMUSCULAR | Status: AC
  Filled 2018-01-21: qty 20

## 2018-01-21 MED ORDER — LACTATED RINGERS IV SOLN: INTRAVENOUS | Status: DC

## 2018-01-21 MED ORDER — LACTATED RINGERS IV SOLN
INTRAVENOUS | Status: DC
  Administered 2018-01-21: 01:00:00 via INTRAUTERINE

## 2018-01-21 MED ORDER — FENTANYL CITRATE (PF) 100 MCG/2ML IJ SOLN
25.0000 ug | Freq: Once | INTRAMUSCULAR | Status: AC
  Administered 2018-01-21: 50 ug via INTRAVENOUS

## 2018-01-21 MED ORDER — MEPERIDINE HCL 25 MG/ML IJ SOLN
INTRAMUSCULAR | Status: DC | PRN
  Administered 2018-01-21: 12.5 mg via INTRAVENOUS

## 2018-01-21 MED ORDER — OXYTOCIN 40 UNITS IN LACTATED RINGERS INFUSION - SIMPLE MED: 2.5000 [IU]/h | INTRAVENOUS | Status: AC

## 2018-01-21 MED ORDER — TETANUS-DIPHTH-ACELL PERTUSSIS 5-2.5-18.5 LF-MCG/0.5 IM SUSP: 0.5000 mL | Freq: Once | INTRAMUSCULAR | Status: DC

## 2018-01-21 MED ORDER — FENTANYL CITRATE (PF) 100 MCG/2ML IJ SOLN
INTRAMUSCULAR | Status: AC
  Filled 2018-01-21: qty 2

## 2018-01-21 SURGICAL SUPPLY — 38 items
CHLORAPREP W/TINT 26ML (MISCELLANEOUS) ×6 IMPLANT
CLAMP CORD UMBIL (MISCELLANEOUS) IMPLANT
CLOTH BEACON ORANGE TIMEOUT ST (SAFETY) ×3 IMPLANT
DERMABOND ADHESIVE PROPEN (GAUZE/BANDAGES/DRESSINGS) ×2
DERMABOND ADVANCED (GAUZE/BANDAGES/DRESSINGS) ×2
DERMABOND ADVANCED .7 DNX12 (GAUZE/BANDAGES/DRESSINGS) ×1 IMPLANT
DERMABOND ADVANCED .7 DNX6 (GAUZE/BANDAGES/DRESSINGS) ×1 IMPLANT
DRSG OPSITE POSTOP 4X10 (GAUZE/BANDAGES/DRESSINGS) ×3 IMPLANT
ELECT REM PT RETURN 9FT ADLT (ELECTROSURGICAL) ×3
ELECTRODE REM PT RTRN 9FT ADLT (ELECTROSURGICAL) ×1 IMPLANT
EXTRACTOR VACUUM BELL STYLE (SUCTIONS) IMPLANT
GLOVE BIOGEL PI IND STRL 7.0 (GLOVE) ×1 IMPLANT
GLOVE BIOGEL PI IND STRL 8 (GLOVE) ×1 IMPLANT
GLOVE BIOGEL PI INDICATOR 7.0 (GLOVE) ×2
GLOVE BIOGEL PI INDICATOR 8 (GLOVE) ×2
GLOVE ECLIPSE 8.0 STRL XLNG CF (GLOVE) ×3 IMPLANT
GOWN STRL REUS W/TWL LRG LVL3 (GOWN DISPOSABLE) ×6 IMPLANT
KIT ABG SYR 3ML LUER SLIP (SYRINGE) ×3 IMPLANT
NEEDLE HYPO 18GX1.5 BLUNT FILL (NEEDLE) ×3 IMPLANT
NEEDLE HYPO 22GX1.5 SAFETY (NEEDLE) ×3 IMPLANT
NEEDLE HYPO 25X5/8 SAFETYGLIDE (NEEDLE) ×3 IMPLANT
NS IRRIG 1000ML POUR BTL (IV SOLUTION) ×3 IMPLANT
PACK C SECTION WH (CUSTOM PROCEDURE TRAY) ×3 IMPLANT
PAD OB MATERNITY 4.3X12.25 (PERSONAL CARE ITEMS) ×3 IMPLANT
PENCIL SMOKE EVAC W/HOLSTER (ELECTROSURGICAL) ×3 IMPLANT
RTRCTR C-SECT PINK 25CM LRG (MISCELLANEOUS) IMPLANT
SUT CHROMIC 0 CT 1 (SUTURE) ×3 IMPLANT
SUT MNCRL 0 VIOLET CTX 36 (SUTURE) ×3 IMPLANT
SUT MONOCRYL 0 CTX 36 (SUTURE) ×6
SUT PLAIN 2 0 (SUTURE)
SUT PLAIN 2 0 XLH (SUTURE) IMPLANT
SUT PLAIN ABS 2-0 CT1 27XMFL (SUTURE) IMPLANT
SUT VIC AB 0 CTX 36 (SUTURE) ×2
SUT VIC AB 0 CTX36XBRD ANBCTRL (SUTURE) ×1 IMPLANT
SUT VIC AB 4-0 KS 27 (SUTURE) ×3 IMPLANT
SYR 20CC LL (SYRINGE) ×6 IMPLANT
TOWEL OR 17X24 6PK STRL BLUE (TOWEL DISPOSABLE) ×3 IMPLANT
TRAY FOLEY BAG SILVER LF 14FR (SET/KITS/TRAYS/PACK) IMPLANT

## 2018-01-21 NOTE — Lactation Note (Signed)
This note was copied from a baby's chart. Lactation Consultation Note  Patient Name: Brittany Long FTDDU'KToday's Date: 01/21/2018 Reason for consult: Initial assessment;Term;Other (Comment)(initially blood sugar low , since 2 good ones / mom encouraged  to page )  Pecola LeisureBaby is 9 hours old LC reviewed and updated the doc flow sheets per mom and grandmother.  Baby last fed at 1030 for 20 mins, and voided / stool.  Grandmother holding baby. Mom receptive to having the LC review and teach hand expressing.  See below.  LC asked mom to call on the nurses light when the baby is showing feeding cues.  LC asked the MBU  RN to assist with shells when mom is wearing bra, and coconut oil for  Dry nipples and areolas. When showing mom how to hand express , she mentioned the areolas  Are tender.  Mother informed of post-discharge support and given phone number to the lactation department, including services for phone call assistance; out-patient appointments; and breastfeeding support group. List of other breastfeeding resources in the community given in the handout. Encouraged mother to call for problems or concerns related to breastfeeding.   Maternal Data Has patient been taught Hand Expression?: Yes(mom able to return demo with results , colostrum containers provided )  Feeding Feeding Type: (baby last fed 10:30 pm ) Length of feed: 20 min  LATCH Score( Latch Score by the [facility] )  Latch: Grasps breast easily, tongue down, lips flanged, rhythmical sucking.  Audible Swallowing: A few with stimulation  Type of Nipple: Everted at rest and after stimulation  Comfort (Breast/Nipple): Soft / non-tender  Hold (Positioning): Assistance needed to correctly position infant at breast and maintain latch.  LATCH Score: 8  Interventions Interventions: Breast feeding basics reviewed  Lactation Tools Discussed/Used WIC Program: Yes(per mom has not had appt, since January )   Consult Status Consult Status:  Follow-up Date: 01/21/18 Follow-up type: In-patient    Matilde SprangMargaret Ann Lenin Kuhnle 01/21/2018, 12:13 PM

## 2018-01-21 NOTE — Plan of Care (Signed)
POC discussed with pt and her mother.  No questions or concerns at this time.

## 2018-01-21 NOTE — Anesthesia Postprocedure Evaluation (Signed)
Anesthesia Post Note  Patient: Brittany Long  Procedure(s) Performed: CESAREAN SECTION (N/A )     Patient location during evaluation: Mother Baby Anesthesia Type: Epidural Level of consciousness: awake, awake and alert, oriented and patient cooperative Pain management: pain level controlled Vital Signs Assessment: post-procedure vital signs reviewed and stable Respiratory status: spontaneous breathing Cardiovascular status: blood pressure returned to baseline Postop Assessment: no headache, no backache, epidural receding, no apparent nausea or vomiting and adequate PO intake Anesthetic complications: no    Last Vitals:  Vitals:   01/21/18 0541 01/21/18 0630  BP: (!) 159/66 (!) 141/89  Pulse: 75 87  Resp: (!) 22 20  Temp: 37.1 C 36.8 C  SpO2: 96% 96%    Last Pain:  Vitals:   01/21/18 0630  TempSrc: Oral  PainSc:    Pain Goal:                 Trellis PaganiniBREWER,Alejandro Adcox N

## 2018-01-21 NOTE — Lactation Note (Signed)
This note was copied from a baby's chart. Lactation Consultation Note  Patient Name: Brittany Long GermanRacheal Geddes UJWJX'BToday's Date: 01/21/2018 Reason for consult: Follow-up assessment;Term  2nd LC visit today, mom requested assist with latch.  As LC entered the room , baby was latched / shallow/ and noted  Dimpling in cheeks. LC assisted - re-latch on the right football,  Depth obtained/ swallows noted, increased with breast compressions.  After feeding 15 mins , LC changed a heavy wet diaper, and assisted to  Latch on the left breast/ football, areola more compressible, depth obtained  , swallows, increased with breast compressions.  Mom comfortable with both latches.  LC instructed mom on the use of shells between feedings except when sleeping.  ( grandmother plans to go buy a bra for mom )  In the mean time LC recommended after breast massage, hand express, pre-pump  With hand pump and reverse pressure, latch with firm support.  Baby still feeding at 7 mins.  Mom, dad, and grandmother receptive to teaching.    Maternal Data Has patient been taught Hand Expression?: Yes Does the patient have breastfeeding experience prior to this delivery?: No  Feeding Feeding Type: Breast Fed(breast massage, hand express, prepump , reverse pressure ) Length of feed: (still feeding at 7 mins , swallows, increased breast compressions )  LATCH Score Latch: Grasps breast easily, tongue down, lips flanged, rhythmical sucking.  Audible Swallowing: Spontaneous and intermittent  Type of Nipple: Flat(areola more compressible )  Comfort (Breast/Nipple): Soft / non-tender  Hold (Positioning): Assistance needed to correctly position infant at breast and maintain latch.  LATCH Score: 8  Interventions Interventions: Breast feeding basics reviewed;Assisted with latch;Skin to skin;Breast massage;Hand express;Pre-pump if needed;Reverse pressure;Breast compression;Adjust position;Support pillows;Position  options;Expressed milk;Shells;Hand pump  Lactation Tools Discussed/Used Tools: Shells;Pump(LC instructed mom on use of shells and hand pump ) Shell Type: Inverted Breast pump type: Manual WIC Program: Yes(per mom has not had appt, since January ) Pump Review: Setup, frequency, and cleaning Initiated by:: MAI  Date initiated:: 01/21/18   Consult Status Consult Status: Follow-up Date: 01/22/18 Follow-up type: In-patient    Matilde SprangMargaret Ann Maricus Tanzi 01/21/2018, 2:46 PM

## 2018-01-21 NOTE — Progress Notes (Signed)
CRITICAL VALUE STICKER  CRITICAL VALUE: 41  RECEIVER (on-site recipient of call): Meleah Demeyer RN  DATE & TIME NOTIFIED:  01/21/2018 1115  MESSENGER (representative from lab):  MD NOTIFIED:   TIME OF NOTIFICATION: 1115  RESPONSE:

## 2018-01-21 NOTE — Progress Notes (Signed)
Obstetrics teaching service progress update  Called to bedside for increased abdominal size and distention. Examined patient and abdomen is more distentended than on my previous exam this morning. No flatus or bowel movements thus far for patient. Has not ambulated yet. Believe most of the distention and discomfort is due to lack of flatus and retained gas. Encouraged patient to ambulate in order to stimulate bowels. Will continue to monitor.  Abdomen: distended. Hyperresonant on percussion. Some tendernss to moderate palpation.   Myrene BuddyJacob Michale Weikel MD PGY-1 Family Medicine Resident

## 2018-01-21 NOTE — Consult Note (Signed)
[facility]  Delivery Note:  C-section       01/21/2018  2:53 AM  I was called to the operating room at the request of the patient's obstetrician (Dr. Despina HiddenEure) for a c-section due to non-reassuring fetal heart tones.  PRENATAL HX:  This is a 27 y/o G2P0010 at 4239 and 2/[redacted] weeks gestation who was admitted for IOL for chronic hypertension.  Her pregnancy has otherwise been uncomplicated.  She is GBS negative with ROM x9 hours.  C-section for non-reassuring fetal heart tones.    DELIVERY:  Infant had poor tone at delivery with HR < 100 despite warming, drying, and stimulation.  PPV administered for 30 seconds with improvement in HR to ~ 150 bpm.  He continued to poor tone and central cyanosis.  O2 saturations were in mid 60s at 3 minutes of age.  Blow by O2 administered from 3 minutes of age to 10 minutes of age, then O2 saturations remained in mid 90s in RA.  Hypotonia improved by 10 minutes of age.  APGARs 1, 6 and 8.  Exam notable for molding but otherwise within normal limits.  After 15 minutes, baby left with nurse to assist parents with skin-to-skin care.   _____________________ Electronically Signed By: Maryan CharLindsey Kamila Broda, MD Neonatologist

## 2018-01-21 NOTE — Progress Notes (Signed)
Subjective: Postpartum Day 0: Cesarean Delivery Patient reports incisional pain and tolerating PO.    Objective: Vital signs in last 24 hours: Temp:  [98.2 F (36.8 C)-99.9 F (37.7 C)] 99 F (37.2 C) (04/06 1131) Pulse Rate:  [70-161] 75 (04/06 1131) Resp:  [16-33] 20 (04/06 1131) BP: (79-160)/(41-114) 148/86 (04/06 1131) SpO2:  [86 %-100 %] 100 % (04/06 1131)  Physical Exam:  General: alert and cooperative Lochia: appropriate Uterine Fundus: firm Incision: no significant drainage, no significant erythema DVT Evaluation: No evidence of DVT seen on physical exam.  Recent Labs    01/20/18 0755 01/21/18 0349  HGB 13.5 12.4  HCT 38.8 35.9*    Assessment/Plan: Status post Cesarean section. Doing well postoperatively.  Continue current care.  Brittany Long 01/21/2018, 12:21 PM

## 2018-01-21 NOTE — Op Note (Signed)
Preoperative diagnosis:  1.  Intrauterine pregnancy at [redacted]w[redacted]d  weeks gestation                                         2.  Repetitive severe variable declerations                                         3.  Multiple fetal bradycardic episodes                                         4.  Non reassuring fetal heart rate tracing   Postoperative diagnosis:  Same as above plus body cord as described below  Procedure:  Primary cesarean section  Surgeon:  Lazaro Arms MD  Assistant:    Anesthesia: Epidural  Findings:  Fetus was having occasional deep variables and had a fetal bradycardic episode at the time of AROM.  However the fetus had another bradycardic episode of 8 minutes at 0154. Shortly thereafter had another deep variable.  Her cervix was 7 cm, remote from delivery and I surmised the source of increased frequency duration and depth of the decelerations was due to fetal descent and tightening of the umbilical cord.  Indeed at the time of surgery there was a cord that went tightly over the right shoulder around the right leg and through the legs taking away the ability to have "slack in the cord" during descent.    Over a low transverse incision was delivered a viable female with Apgars of 1 and 6 and 8 weighing  lbs. pending oz. Uterus, tubes and ovaries were all normal.  There were no other significant findings  Description of operation:  Patient was taken to the operating room and placed in the sitting position where she underwent a spinal anesthetic. She was then placed in the supine position with tilt to the left side. When adequate anesthetic level was obtained she was prepped and draped in usual sterile fashion and a Foley catheter was placed. A Pfannenstiel skin incision was made and carried down sharply to the rectus fascia which was scored in the midline extended laterally. The fascia was taken off the muscles both superiorly and without difficulty. The muscles were divided.  The  peritoneal cavity was entered.  Bladder blade was placed, no bladder flap was created.  A low transverse hysterotomy incision was made and delivered a viable female  infant at 43 with Apgars of 1 and 6 and 8 weighingpending lbs  oz.  Cord pH was obtained and was 7.13. The uterus was exteriorized. It was closed in 2 layers, the first being a running interlocking layer and the second being an imbricating layer using 0 monocryl on a CTX needle. There was good resulting hemostasis. The uterus tubes and ovaries were all normal. Peritoneal cavity was irrigated vigorously. The muscles and peritoneum were reapproximated loosely. The fascia was closed using 0 Vicryl in running fashion. Subcutaneous tissue was made hemostatic and irrigated. The skin was closed using 4-0 Vicryl on a Keith needle in a subcuticular fashion.  Dermabond was placed for additional wound integrity and to serve as a barrier. Blood loss for the procedure was 1066 cc. The patient  received 2 gram of Ancef prophylactically. The patient was taken to the recovery room in good stable condition with all counts being correct x3.  EBL 1066 cc  Lazaro ArmsLuther H Edgel Degnan 01/21/2018 3:30 AM

## 2018-01-21 NOTE — Transfer of Care (Signed)
Immediate Anesthesia Transfer of Care Note  Patient: Brittany Long  Procedure(s) Performed: CESAREAN SECTION (N/A )  Patient Location: PACU  Anesthesia Type:Epidural  Level of Consciousness: awake  Airway & Oxygen Therapy: Patient Spontanous Breathing  Post-op Assessment: Report given to RN and Post -op Vital signs reviewed and stable  Post vital signs: stable  Last Vitals:  Vitals Value Taken Time  BP 140/70 01/21/2018  3:39 AM  Temp    Pulse 86 01/21/2018  3:40 AM  Resp 28 01/21/2018  3:40 AM  SpO2 100 % 01/21/2018  3:40 AM  Vitals shown include unvalidated device data.  Last Pain:  Vitals:   01/21/18 0151  TempSrc:   PainSc: 0-No pain         Complications: No apparent anesthesia complications

## 2018-01-22 LAB — CBC
HCT: 30.7 % — ABNORMAL LOW (ref 36.0–46.0)
Hemoglobin: 10.3 g/dL — ABNORMAL LOW (ref 12.0–15.0)
MCH: 27 pg (ref 26.0–34.0)
MCHC: 33.6 g/dL (ref 30.0–36.0)
MCV: 80.6 fL (ref 78.0–100.0)
PLATELETS: 181 10*3/uL (ref 150–400)
RBC: 3.81 MIL/uL — ABNORMAL LOW (ref 3.87–5.11)
RDW: 14.9 % (ref 11.5–15.5)
WBC: 10.4 10*3/uL (ref 4.0–10.5)

## 2018-01-22 MED ORDER — POLYETHYLENE GLYCOL 3350 17 G PO PACK
17.0000 g | PACK | Freq: Every day | ORAL | Status: DC
  Administered 2018-01-22 – 2018-01-25 (×4): 17 g via ORAL
  Filled 2018-01-22 (×6): qty 1

## 2018-01-22 MED ORDER — RHO D IMMUNE GLOBULIN 1500 UNIT/2ML IJ SOSY
300.0000 ug | PREFILLED_SYRINGE | Freq: Once | INTRAMUSCULAR | Status: AC
  Administered 2018-01-22: 300 ug via INTRAVENOUS
  Filled 2018-01-22: qty 2

## 2018-01-22 MED ORDER — BISACODYL 10 MG RE SUPP
10.0000 mg | Freq: Four times a day (QID) | RECTAL | Status: DC | PRN
  Administered 2018-01-22: 10 mg via RECTAL
  Filled 2018-01-22: qty 1

## 2018-01-22 NOTE — Progress Notes (Signed)
Nurse at bedside to update pt on baby's blood sugars.  Pt asked nurse for assistance with breast pump.  Nurse set up breast pump and explained to pt the set up, use and care of it.  Nurse instructed pt not to expect to fill up the large storage bottles.  Nurse instructed pt to feed the milk expressed to the infant as instructed by LC.  Pt and mom verb understanding.

## 2018-01-22 NOTE — Progress Notes (Signed)
POSTPARTUM PROGRESS NOTE  POD#1  Subjective:  Brittany Long is a 27 y.o. G2P1011 s/p LTCS at 6147w2d.  No acute events overnight.  Pt denies problems with ambulating, voiding or po intake. Eating breakfast this morning w/o problems. She denies nausea or vomiting.  She has not passed flatus or had BM. She reports pain control with oxycodone. Vaginal bleeding decreasing.  Objective: Blood pressure 120/86, pulse 82, temperature 98.3 F (36.8 C), temperature source Oral, resp. rate 16, height 5' 4.5" (1.638 m), weight 186 lb 1.9 oz (84.4 kg), last menstrual period 04/21/2017, SpO2 99 %, unknown if currently breastfeeding.  Physical Exam:  General: alert, cooperative and no distress Chest: no respiratory distress Heart:regular rate, distal pulses intact Abdomen: soft, nontender, moderately distended, no guarding Uterine Fundus: firm, appropriately tender DVT Evaluation: No calf swelling or tenderness Extremities: no edema Skin: warm, dry; incision clean/dry/intact  Recent Labs    01/21/18 1200 01/22/18 0600  HGB 12.4 10.3*  HCT 35.9* 30.7*    Assessment/Plan: Brittany Long is a 27 y.o. G2P1011 s/p LTCS at 2147w2d   POD#1: doing well   Continue routine care  OOB, ambulate  Hgb drop appropriate Abdominal distention: simethicone ordered; miralax as also reports constipation prior to  Contraception: undecided; discussed options Feeding: breast Dispo: Plan for discharge likely tomorrow.   LOS: 3 days   Kandra NicolasJulie P DegeleMD 01/22/2018, 8:00 AM

## 2018-01-23 ENCOUNTER — Inpatient Hospital Stay (HOSPITAL_COMMUNITY): Payer: Medicaid Other

## 2018-01-23 ENCOUNTER — Other Ambulatory Visit: Payer: Self-pay

## 2018-01-23 DIAGNOSIS — Z98891 History of uterine scar from previous surgery: Secondary | ICD-10-CM

## 2018-01-23 LAB — BPAM RBC
Blood Product Expiration Date: 201905022359
Blood Product Expiration Date: 201905022359
UNIT TYPE AND RH: 9500
Unit Type and Rh: 9500

## 2018-01-23 LAB — RH IG WORKUP (INCLUDES ABO/RH)
ABO/RH(D): O NEG
FETAL SCREEN: NEGATIVE
Gestational Age(Wks): 39.2
Unit division: 0

## 2018-01-23 LAB — TYPE AND SCREEN
ABO/RH(D): O NEG
ANTIBODY SCREEN: POSITIVE
UNIT DIVISION: 0
Unit division: 0

## 2018-01-23 MED ORDER — BISACODYL 5 MG PO TBEC
10.0000 mg | DELAYED_RELEASE_TABLET | Freq: Once | ORAL | Status: DC
  Filled 2018-01-23: qty 2

## 2018-01-23 MED ORDER — CHEWING GUM (ORBIT) SUGAR FREE
1.0000 | CHEWING_GUM | ORAL | Status: DC
  Filled 2018-01-23: qty 1

## 2018-01-23 NOTE — Progress Notes (Signed)
CNM called with results of abd xray.  Orders received to make pt NPO at this time.  Pt and family informed.

## 2018-01-23 NOTE — Progress Notes (Signed)
Called by RN to evaluate abdominal distension.   S: Pt reports increased abdominal pain and size. No flatus. Denies N/V. Has ambulated in halls today. Drinking warm liquids. Tolerating po but had small amt of solids today.  O: VSS Abd: firm, ++dstended, BS hypoactive in RUQ, absent in other quadrants  A/P:  Post-op abd distension, r/o post-op ileus KUB Continue Simethicone Encouraged more ambulation

## 2018-01-23 NOTE — Plan of Care (Signed)
Progressing appropriately. Patient encouraged to walk and drink water to encourage regular bowel movements.

## 2018-01-23 NOTE — Progress Notes (Signed)
Post Partum Day 2 Subjective: up ad lib, voiding and tolerating PO Small amt of flatus and loose stool this am C/o abdominal pain and distension Pain improved with Percocet  Objective: Blood pressure 138/72, pulse (!) 106, temperature 98.9 F (37.2 C), temperature source Oral, resp. rate 18, height 5' 4.5" (1.638 m), weight 186 lb 1.9 oz (84.4 kg), last menstrual period 04/21/2017, SpO2 99 %, unknown if currently breastfeeding.  Physical Exam:  General: alert, cooperative and no distress Abd: firm, distended, tender, BS only in RUQ Lochia: appropriate Uterine Fundus: firm Incision: healing well, no significant drainage, no dehiscence, no significant erythema, honeycomb c/d/i DVT Evaluation: No evidence of DVT seen on physical exam. Negative Homan's sign. No cords or calf tenderness. No significant calf/ankle edema.  Recent Labs    01/21/18 1200 01/22/18 0600  HGB 12.4 10.3*  HCT 35.9* 30.7*    Assessment/Plan: Plan for discharge tomorrow and Breastfeeding Post-op ileus-confirmed with KUB SS enema this am Warm liquids Ambulate   LOS: 4 days   Donette LarryMelanie Helen Winterhalter, CNM 01/23/2018, 7:50 AM

## 2018-01-24 NOTE — Progress Notes (Signed)
Patient came to desk irritated that I had not communicated with her about the plan of care at all today.  Patient had been on clear liquids for 3 days and no regular diet was put in unitl Dr. Doroteo GlassmanPhelps told me verbally around 1720ish that she could eat. Dr. Doroteo GlassmanPhelps had been updated on patient's status today already. Patient has been passing liquid stools  No solids stools yet Dr.Phelps aware. Patient did not pass gas per her until this afternoon. I talked to Dr. Doroteo GlassmanPhelps at [phone] she had stated she had already told the patient this morning that she was to do liquids today and try to eat later this evening and then Dr. Doroteo GlassmanPhelps would reevaluate her to see if she can go home after she tolerates her food.  Dr. Doroteo GlassmanPhelps aware that patient is upset about the communication. Management aware of the situation.

## 2018-01-24 NOTE — Lactation Note (Signed)
This note was copied from a baby's chart. Lactation Consultation Note  Patient Name: Brittany Long Date: 01/24/2018   Mom rented a [facility] loaner. Mom observed pumping on L breast with hand pump that was included in breast pump kit. Mom had been using size 27 flanges, but size 24 more appropriate for her at this time. Coconut oil used to lubricate flanges.  Mom also shown how use manual pump on double mode, if needed after she returns the [place].   Mom reported + breast changes w/pregnancy.    Mom now using regular formula instead of Neosure 22.   Matthias Hughs [facility] 01/24/2018, 3:46 PM

## 2018-01-24 NOTE — Progress Notes (Signed)
CSW received consult for hx of Anxiety and Depression.  CSW met with MOB to offer support and complete assessment.   MOB was very quiet, but pleasant and stated that this was a good time to talk with her.  Her mother was present and holding baby who was asleep.  MGM was also quiet, but engaged in the conversation.  MGM states she lives in [place], [place] and has two weeks off from work in order to be here with MOB.  She states plans to travel here on the weekends to be with MOB and baby as well.  MOB states she and FOB/Darius Laurance Flatten are in a relationship and that he lives in the home with her.  She states he works at [employer], but will not have any time off from work.  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  MOB states she and baby are doing well.  She acknowledges feelings of fear when it was determined that she would need to go to the OR for delivery, but is glad it is behind her and now is most concerned with being able to have a bowel movement and go home.  She states she felt well overall during pregnancy and reports no hx of depression (though hx of depression is noted in her medical record) and no emotional concerns at this time.   CSW provided education regarding the baby blues period vs. perinatal mood disorders, discussed treatment and gave resources for mental health follow up if concerns arise.  CSW recommends self-evaluation during the postpartum time period using the New Mom Checklist from Postpartum Progress, as well as the [place] Postnatal Depression Scale (which MOB scored a 1 on this admission) and encouraged MOB to contact a medical professional if symptoms are noted at any time.   CSW provided review of Sudden Infant Death Syndrome (SIDS) precautions.   CSW identifies no further need for intervention and no barriers to discharge at this time.

## 2018-01-24 NOTE — Progress Notes (Signed)
Late Entry  Subjective: Postpartum Day 2: Cesarean Delivery Patient reports no problems voiding.  Had postpartum illeus. States she is passing flatus and has had liquid BM. Tolerating clear liquids  Objective: Vital signs in last 24 hours: Temp:  [98 F (36.7 C)-99.1 F (37.3 C)] 99.1 F (37.3 C) (04/09 1835) Pulse Rate:  [69-91] 91 (04/09 1835) Resp:  [18] 18 (04/09 1835) BP: (109-139)/(67-76) 139/76 (04/09 1835)  Physical Exam:  General: alert, cooperative and no distress Lochia: appropriate Abdomen: Distended, +BS, soft, mild tenderness Uterine Fundus: firm Incision: no significant drainage DVT Evaluation: No evidence of DVT seen on physical exam.  Recent Labs    01/22/18 0600  HGB 10.3*  HCT 30.7*    Assessment/Plan: Status post Cesarean section. Postoperative course complicated by illeus which is improving Continue current care Advance diet Reevaluate for discharge this PM Continue to monitor flatus and BM  Caryl AdaJazma Rilley Poulter, DO 01/24/2018, 7:36 PM

## 2018-01-24 NOTE — Progress Notes (Signed)
Patient was encouraged to walk the halls, she states that she has been walking earlier and will walk again after shower. There was noted distention of patients abdomen, states she has been passing gas but none today, still has not had a bowel movement. Also, patient was made aware of how to use incentive spirometer.

## 2018-01-24 NOTE — Progress Notes (Signed)
Pt seen walking around the parameters of the unit with her mother. Dad is in the room with baby. Upon arrival back to the room, RN asked mom how well did she tolerated her meal and mom said well. pt asked if she's passing gas okay, and she said yes and said that her BM is still the same as it has been. Pt is doing well at this time with no further inquiries. RN will continue to monitor.

## 2018-01-24 NOTE — Progress Notes (Signed)
Dr. Doroteo GlassmanPhelps updated on Patient's status and that she is still distended but soft. Waiting to see about discharge.

## 2018-01-25 ENCOUNTER — Encounter (HOSPITAL_COMMUNITY): Payer: Self-pay | Admitting: *Deleted

## 2018-01-25 MED ORDER — POLYETHYLENE GLYCOL 3350 17 G PO PACK: 17.0000 g | PACK | Freq: Every day | ORAL | 0 refills | Status: DC | PRN

## 2018-01-25 MED ORDER — IBUPROFEN 600 MG PO TABS: 600.0000 mg | ORAL_TABLET | Freq: Four times a day (QID) | ORAL | 0 refills | Status: DC

## 2018-01-25 MED ORDER — SENNOSIDES-DOCUSATE SODIUM 8.6-50 MG PO TABS: 2.0000 | ORAL_TABLET | Freq: Every day | ORAL | 1 refills | Status: DC

## 2018-01-25 MED ORDER — OXYCODONE-ACETAMINOPHEN 5-325 MG PO TABS: 1.0000 | ORAL_TABLET | Freq: Four times a day (QID) | ORAL | 0 refills | Status: AC | PRN

## 2018-01-25 MED ORDER — POLYETHYLENE GLYCOL 3350 17 G PO PACK: 17.0000 g | PACK | Freq: Every day | ORAL | 0 refills | Status: DC

## 2018-01-25 NOTE — Discharge Summary (Signed)
OB Discharge Summary     Patient Name: Brittany Long DOB: 01-Aug-1991 MRN: [id number]  Date of admission: 01/19/2018 Delivering MD: Duane Lope H   Date of discharge: 01/25/2018  Admitting diagnosis: 39WKS CTX Intrauterine pregnancy: [redacted]w[redacted]d     Secondary diagnosis:  Active Problems:   Rh negative state in antepartum period   Chronic hypertension affecting pregnancy   Status post primary low transverse cesarean section  Additional problems: Post-op Illeus     Discharge diagnosis: Term Pregnancy Delivered and CHTN with superimposed preeclampsia                                                                                                Post partum procedures:enema  Augmentation: AROM, Pitocin and Cytotec  Complications: Post-op illeus  Hospital course:  Induction of Labor With Cesarean Section  27 y.o. yo G2P1011 at [redacted]w[redacted]d was admitted to the hospital 01/19/2018 for induction of labor. Patient had a labor course significant for cHTN with SIPE. The patient went for cesarean section due to Non-Reassuring FHR, and delivered a Viable infant,01/21/2018  Membrane Rupture Time/Date: 5:55 PM ,01/20/2018   Details of operation can be found in separate operative Note.  Patient had a complicated postpartum course due to illeus. At time of discharge patietn was having liquid BMs with active bowel sounds and flatus. She is ambulating, tolerating a regular diet, passing flatus, and urinating well.  Patient is discharged home in stable condition on 01/26/18.                                    Physical exam  Vitals:   01/23/18 1747 01/24/18 0640 01/24/18 1835 01/25/18 0622  BP: 120/84 109/67 139/76 118/68  Pulse: (!) 106 69 91 (!) 55  Resp: 19 18 18 18   Temp: 98.9 F (37.2 C) 98 F (36.7 C) 99.1 F (37.3 C) 97.7 F (36.5 C)  TempSrc: Oral Oral Oral Axillary  SpO2: 100%     Weight:      Height:       General: alert, cooperative and no distress Lochia: appropriate Uterine Fundus:  firm Abdomen: distended, soft, +BS Incision: Dressing is clean, dry, and intact DVT Evaluation: No evidence of DVT seen on physical exam. Labs: Lab Results  Component Value Date   WBC 10.4 01/22/2018   HGB 10.3 (L) 01/22/2018   HCT 30.7 (L) 01/22/2018   MCV 80.6 01/22/2018   PLT 181 01/22/2018   CMP Latest Ref Rng & Units 01/20/2018  Glucose 65 - 99 mg/dL 87  BUN 6 - 20 mg/dL 10  Creatinine 4.09 - 8.11 mg/dL 9.14  Sodium 782 - 956 mmol/L 131(L)  Potassium 3.5 - 5.1 mmol/L 3.9  Chloride 101 - 111 mmol/L 106  CO2 22 - 32 mmol/L 16(L)  Calcium 8.9 - 10.3 mg/dL 2.1(H)  Total Protein 6.5 - 8.1 g/dL 7.1  Total Bilirubin 0.3 - 1.2 mg/dL 0.5  Alkaline Phos 38 - 126 U/L 220(H)  AST 15 - 41 U/L 28  ALT 14 - 54 U/L  27    Discharge instruction: per After Visit Summary and "Baby and Me Booklet".  After visit meds:  Allergies as of 01/25/2018   No Known Allergies     Medication List    TAKE these medications   cetirizine 10 MG tablet Commonly known as:  ZYRTEC Take 10 mg by mouth daily.   diphenhydrAMINE 25 MG tablet Commonly known as:  BENADRYL Take 25 mg by mouth every 6 (six) hours as needed for allergies.   ibuprofen 600 MG tablet Commonly known as:  ADVIL,MOTRIN Take 1 tablet (600 mg total) by mouth every 6 (six) hours.   oxyCODONE-acetaminophen 5-325 MG tablet Commonly known as:  PERCOCET/ROXICET Take 1 tablet by mouth every 6 (six) hours as needed for severe pain.   polyethylene glycol packet Commonly known as:  MIRALAX / GLYCOLAX Take 17 g by mouth daily.   PREPLUS 27-1 MG Tabs Take by mouth.   senna-docusate 8.6-50 MG tablet Commonly known as:  Senokot-S Take 2 tablets by mouth at bedtime.       Diet: routine diet  Activity: Advance as tolerated. Pelvic rest for 6 weeks.   Outpatient follow up:4 weeks Follow up Appt: Future Appointments  Date Time Provider Department Center  02/06/2018 10:00 AM [facility] NURSE [facility]  02/28/2018  3:15 PM  Pincus LargePhelps, Quinnley Colasurdo Y, DO [facility]    Postpartum contraception: Undecided  Newborn Data: Live born female  Birth Weight: 7 lb 14.3 oz (3580 g) APGAR: 1, 6  Newborn Delivery   Birth date/time:  01/21/2018 02:41:00 Delivery type:  C-Section, Low Transverse Trial of labor:  Yes C-section categorization:  Primary     Baby Feeding: Breast Disposition:home with mother   01/25/2018 Caryl AdaJazma Mabelle Mungin, DO

## 2018-01-25 NOTE — Discharge Instructions (Signed)

## 2018-01-28 DIAGNOSIS — D62 Acute posthemorrhagic anemia: Secondary | ICD-10-CM

## 2018-02-06 ENCOUNTER — Ambulatory Visit (INDEPENDENT_AMBULATORY_CARE_PROVIDER_SITE_OTHER): Payer: Medicaid Other | Admitting: *Deleted

## 2018-02-06 VITALS — BP 121/73 | HR 81 | Ht 64.5 in | Wt 147.3 lb

## 2018-02-06 DIAGNOSIS — O114 Pre-existing hypertension with pre-eclampsia, complicating childbirth: Secondary | ICD-10-CM

## 2018-02-06 DIAGNOSIS — O1092 Unspecified pre-existing hypertension complicating childbirth: Secondary | ICD-10-CM

## 2018-02-06 DIAGNOSIS — Z4889 Encounter for other specified surgical aftercare: Secondary | ICD-10-CM

## 2018-02-06 NOTE — Progress Notes (Signed)
Pt reports BM's are soft but still runny. She denies H/A or visual disturbances. C/S incision assessed and found to be healing well. Skin edges approximated and no bleeding, swelling, redness or drainage noted. Small amount of skin glue noted.  Pt was advised of proper cleansing technique of the area. Pt asked if she can use ProActiv skin care and Neutrogena acne medication. She is still breastfeeding. Consult with Dr. Vergie LivingPickens and Marylynn Pearsonarrie Hillman, RNC [facility]. Pt advised she may use the requested skin care items. Pt voiced understanding of all information and instructions given.

## 2018-02-07 NOTE — Progress Notes (Signed)
I have reviewed the chart and agree with nursing staff's documentation of this patient's encounter.  [place] Bingharlie Tascha Casares, MD 02/07/2018 8:31 AM

## 2018-02-28 ENCOUNTER — Ambulatory Visit (INDEPENDENT_AMBULATORY_CARE_PROVIDER_SITE_OTHER): Payer: Medicaid Other | Admitting: Obstetrics and Gynecology

## 2018-02-28 MED ORDER — NORETHINDRONE 0.35 MG PO TABS: 1.0000 | ORAL_TABLET | Freq: Every day | ORAL | 11 refills | Status: AC

## 2018-02-28 NOTE — Progress Notes (Signed)
Subjective:     Brittany Long is a 27 y.o. female who presents for a postpartum visit. She is 5 weeks postpartum following a low cervical transverse Cesarean section. I have fully reviewed the prenatal and intrapartum course. The delivery was at [redacted]w[redacted]d gestational weeks. Outcome: primary cesarean section, low transverse incision due to NRFHT. Anesthesia: epidural. Intrapartum course complicated by cHTN and SIPE. Patient denies PIH symptoms.  Postpartum course has been uncomplicated. Baby's course has been uncomplicated. Baby is feeding by both breast and bottle - Brittany Long. Bleeding no bleeding. Bowel function is normal. Bladder function is normal. Patient is not sexually active. Contraception method desired is oral progesterone-only contraceptive. Postpartum depression screening: negative.  The following portions of the patient's history were reviewed and updated as appropriate: allergies, current medications, past family history, past medical history, past social history, past surgical history and problem list.  Review of Systems Pertinent items are noted in HPI.   Objective:    BP 125/71   Pulse (!) 54   Wt 140 lb (63.5 kg)   LMP 04/21/2017   BMI 23.66 kg/m   General:  alert, cooperative and no distress   Breasts:  negative  Lungs: normal work of breathing  Heart:  regular rate and rhythm  Abdomen: soft, non-tender; bowel sounds normal; no masses,  no organomegaly, non-distended  Pelvic:  not evaluated  Psych: normal mood and affect  Skin:  warm and dry        Assessment:   Normal postpartum exam. Pap smear not done at today's visit.   Plan:   1. Contraception: oral progesterone-only contraceptive prescribed. 2. Annual exam in a year. Pap smear up to date.  3. CHTN: BPs stable. No medication at this time. Needs to follow-up with PCP for monitoring.  4. Follow up in: 1 year or as needed.    Brittany Ada, DO OB Fellow [facility], [facility]

## 2018-03-03 ENCOUNTER — Encounter: Payer: Self-pay | Admitting: Obstetrics and Gynecology

## 2019-08-29 IMAGING — US US MFM OB FOLLOW-UP
1 series · 14 of 28 positions shown · non-contrast
Comparison: none

[Series 1: us mfm ob follow-up · 64 acquisitions, 14 frames shown]
[im 3/64]
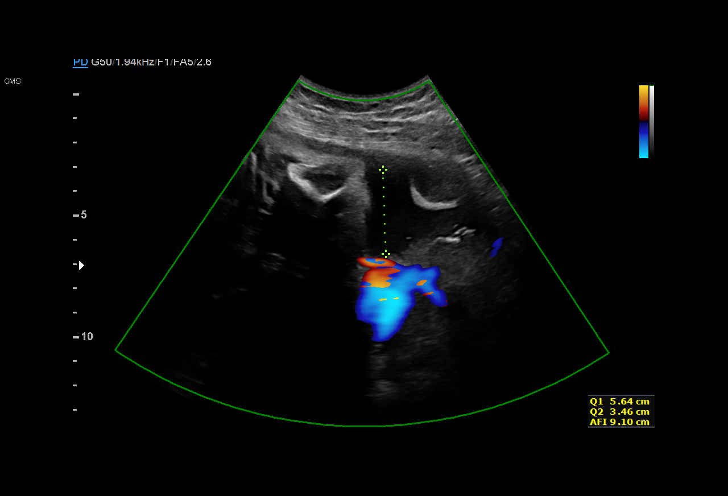
[im 8/64]
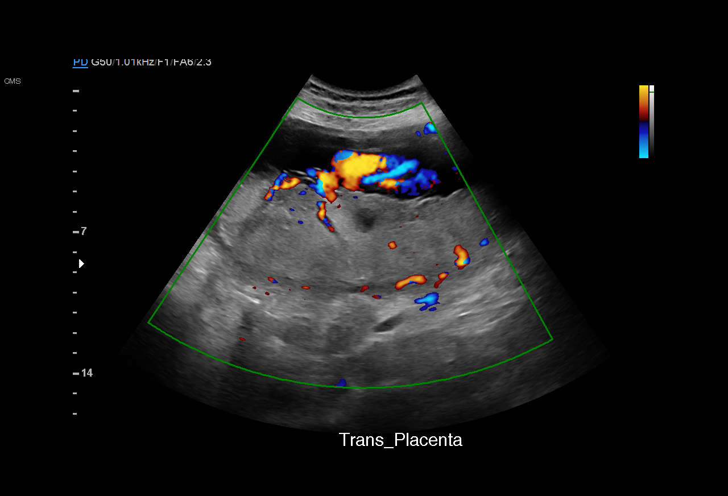
[im 12/64]
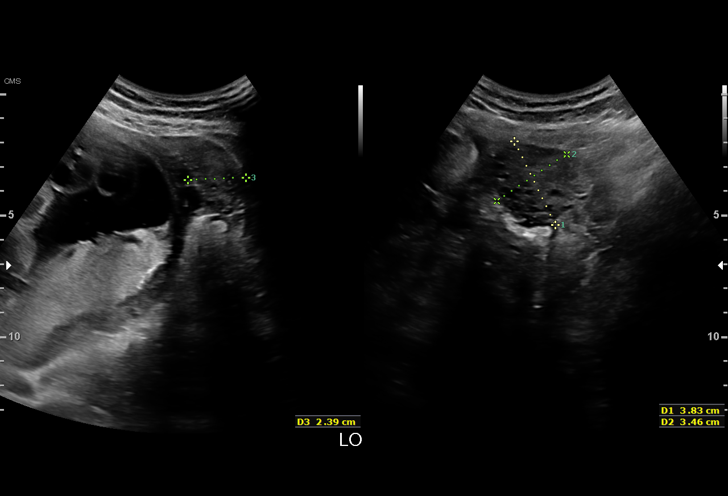
[im 17/64]
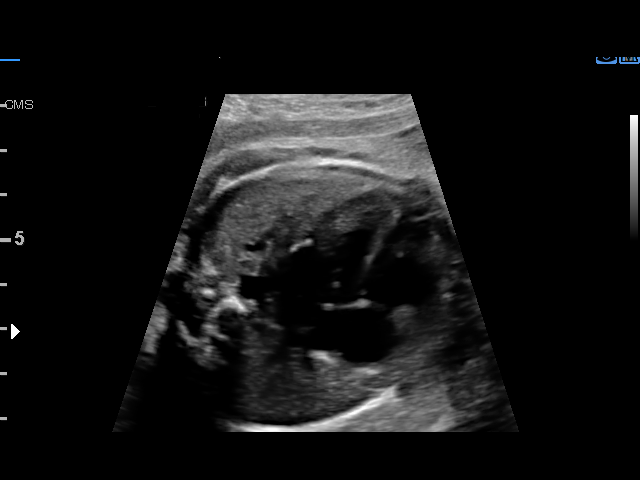
[im 22/64]
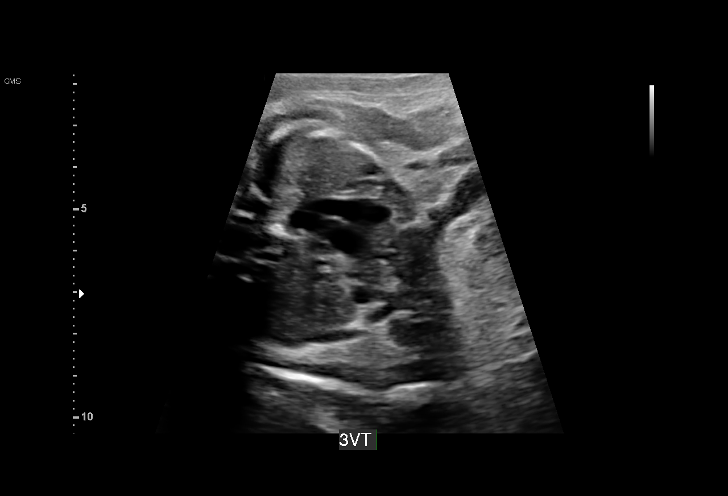
[im 26/64]
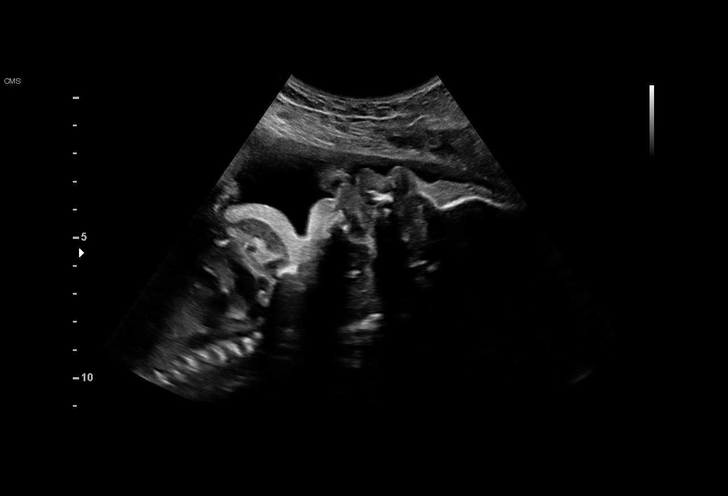
[im 31/64]
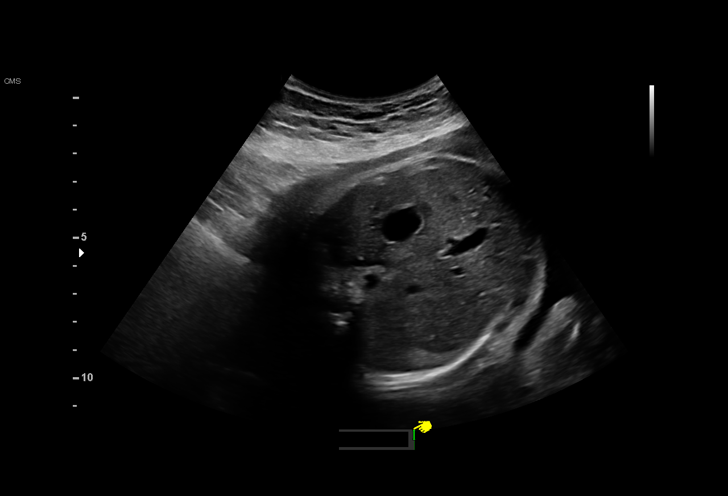
[im 36/64]
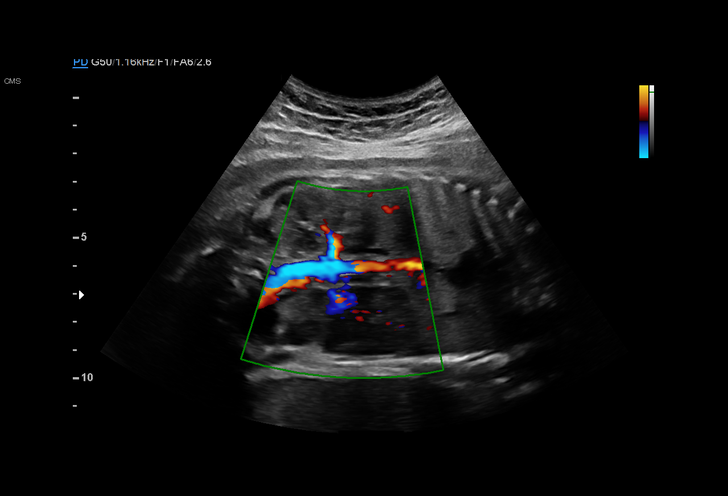
[im 40/64]
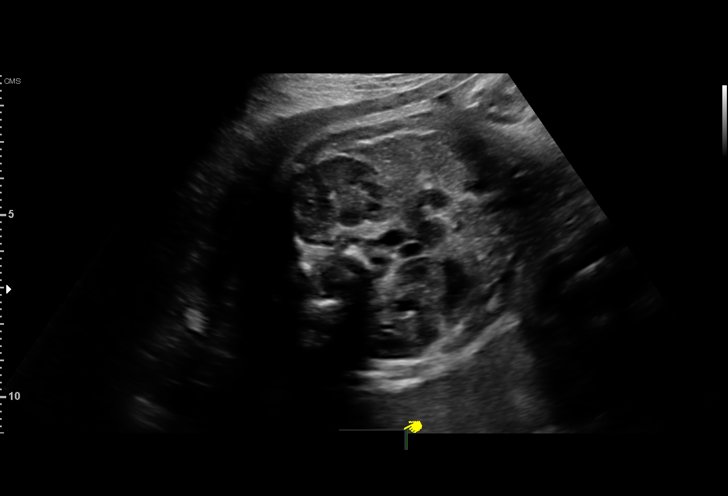
[im 45/64]
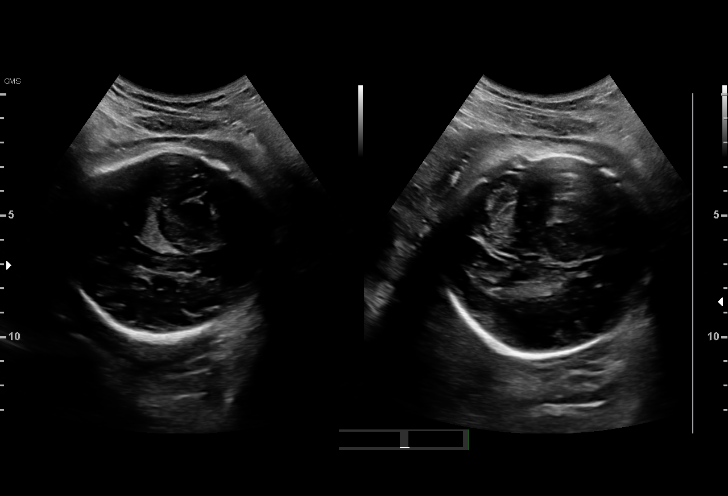
[im 50/64]
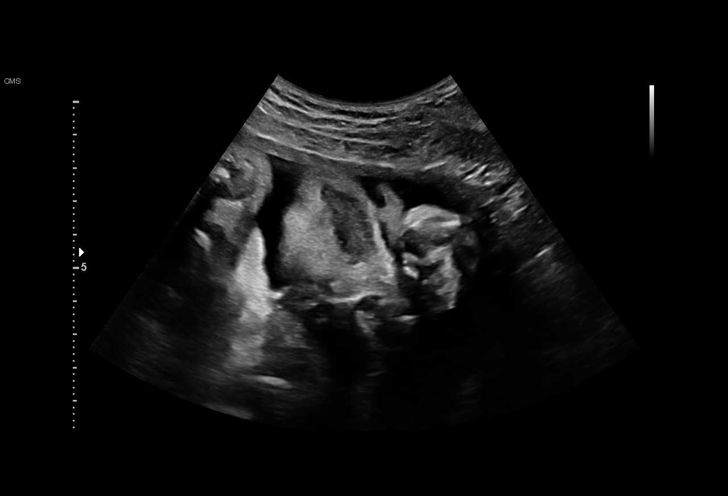
[im 54/64]
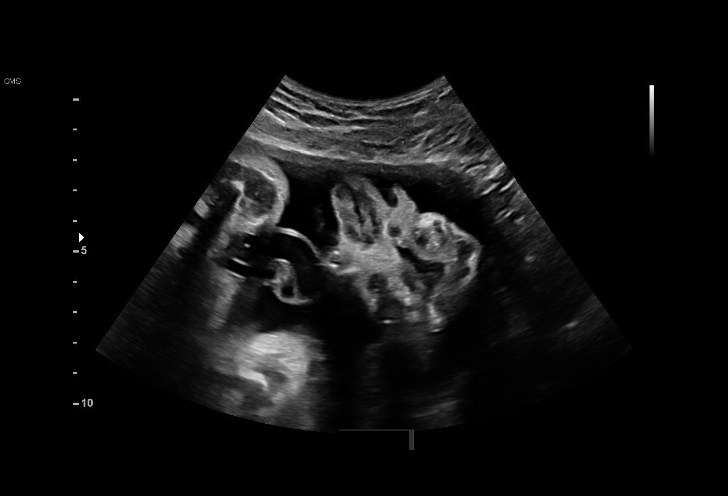
[im 59/64]
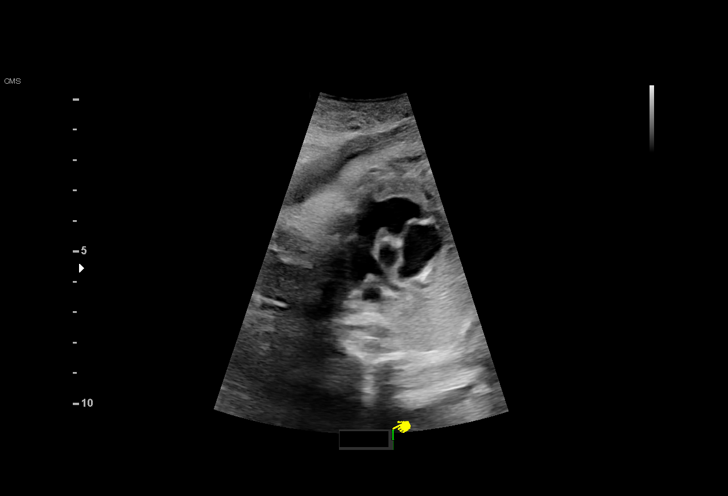
[im 64/64]
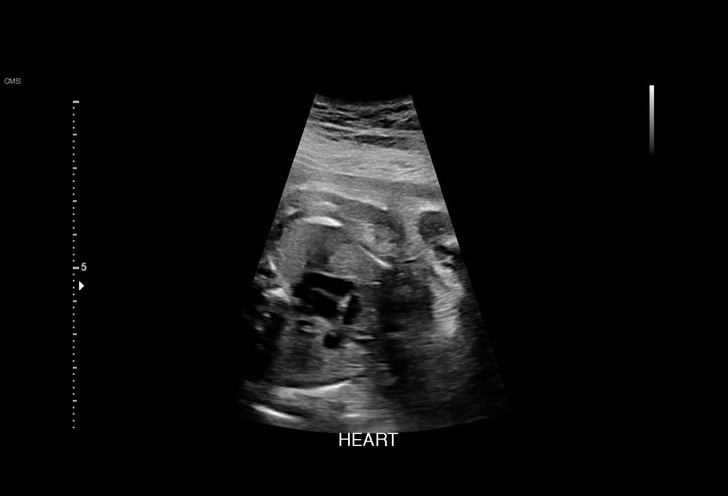

[14 of 28 positions shown; findings below may reference images not displayed]

OXENDINE NP

1  PRENSES ALOUA          [id number]      [id number]     [id number]
Indications

30 weeks gestation of pregnancy
Hypertension - Chronic/Pre-existing (no
meds)
Encounter for other antenatal screening
follow-up
OB History

Blood Type:            Height:  5'4"   Weight (lb):  162       BMI:
Gravidity:    2         Term:   0         SAB:   1
Living:       0
Fetal Evaluation

Num Of Fetuses:     1
Fetal Heart         129
Rate(bpm):
Cardiac Activity:   Observed
Presentation:       Cephalic
Placenta:           Posterior, above cervical os
P. Cord Insertion:  Visualized

Amniotic Fluid
AFI FV:      Subjectively within normal limits

AFI Sum(cm)     %Tile       Largest Pocket(cm)
17.92           67

RUQ(cm)       RLQ(cm)       LUQ(cm)        LLQ(cm)
5.64
Biometry
BPD:      81.7  mm     G. Age:  32w 6d         94  %    CI:         77.77  %    70 - 86
FL/HC:       19.6  %    19.3 -
HC:      293.2  mm     G. Age:  32w 2d         65  %    HC/AC:       1.05       0.96 -
AC:      279.5  mm     G. Age:  32w 0d         83  %    FL/BPD:      70.5  %    71 - 87
FL:       57.6  mm     G. Age:  30w 1d         25  %    FL/AC:       20.6  %    20 - 24
HUM:        52  mm     G. Age:  30w 2d         48  %

Est. FW:    7500   gm    3 lb 15 oz     73  %
Gestational Age

LMP:           30w 4d        Date:  04/21/17                 EDD:    01/26/18
U/S Today:     31w 6d                                        EDD:    01/17/18
Best:          30w 4d     Det. By:  LMP  (04/21/17)          EDD:    01/26/18
Anatomy

Cranium:               Appears normal         Aortic Arch:            Appears normal
Cavum:                 Previously seen        Ductal Arch:            Appears normal
Ventricles:            Appears normal         Diaphragm:              Previously seen
Choroid Plexus:        Appears normal         Stomach:                Appears normal, left
sided
Cerebellum:            Appears normal         Abdomen:                Appears normal
Posterior Fossa:       Previously seen        Abdominal Wall:         Previously seen
Nuchal Fold:           Previously seen        Cord Vessels:           Appears normal (3
vessel cord)
Face:                  Appears normal         Kidneys:                Appear normal
(orbits and profile)
Lips:                  Appears normal         Bladder:                Appears normal
Thoracic:              Appears normal         Spine:                  Previously seen
Heart:                 Appears normal         Upper Extremities:      Previously seen
(4CH, axis, and situs
RVOT:                  Appears normal         Lower Extremities:      Previously seen
LVOT:                  Appears normal

Other:  Male gender. Nasal bone visualized. 5th digit previously visualized.
Cervix Uterus Adnexa

Cervix
Not visualized (advanced GA >62wks)

Uterus
No abnormality visualized.

Left Ovary
Within normal limits.

Right Ovary
Within normal limits.

Cul De Sac:   No free fluid seen.

Adnexa:       No abnormality visualized.
Impression

SIUP at 30+4 weeks
Normal interval anatomy; anatomic survey complete
Normal amniotic fluid volume
Appropriate interval growth with EFW at the 73rd %tile
Recommendations

Follow-up ultrasound for growth in 4-6 weeks

## 2019-09-14 IMAGING — US US FETAL BPP W/ NON-STRESS
1 series · 13 of 14 positions shown · non-contrast
Comparison: none

[Series 1: us fetal bpp w/nonstress · 14 acquisitions, 13 frames shown]
[im 1/14]
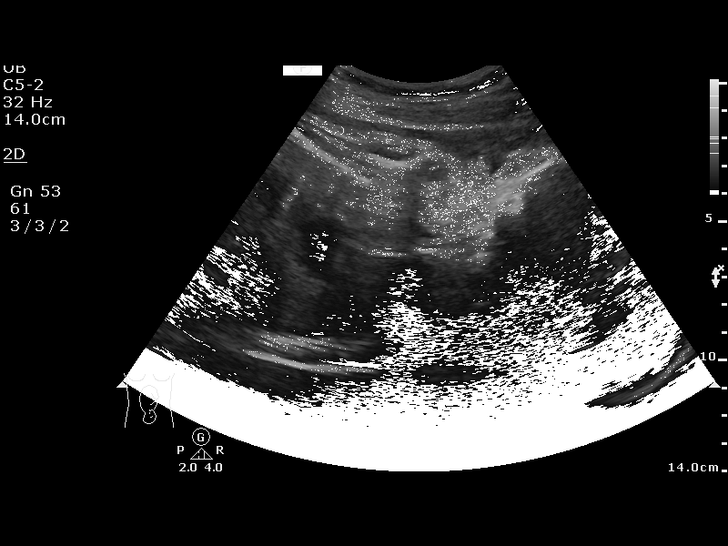
[im 2/14]
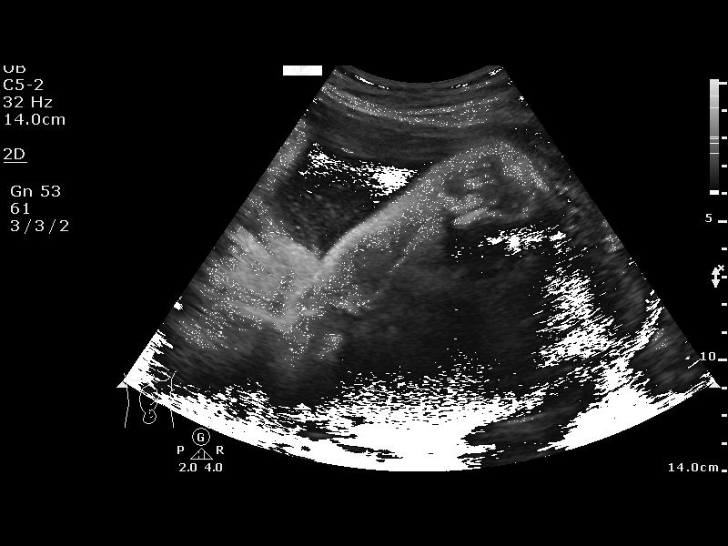
[im 3/14]
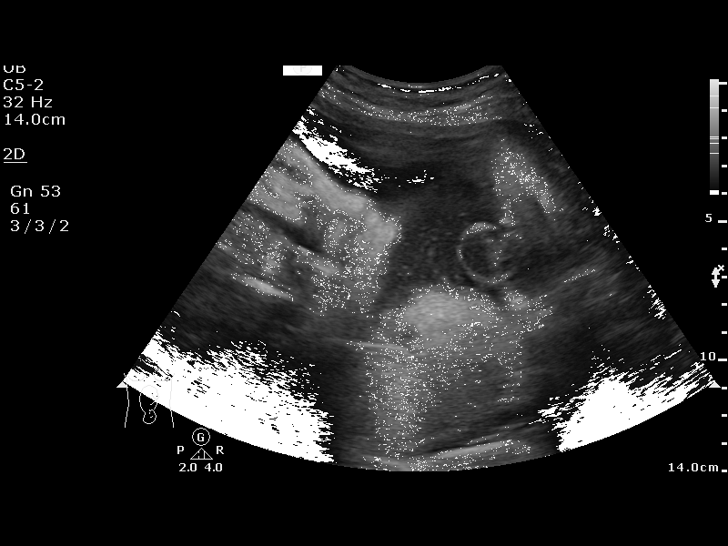
[im 4/14]
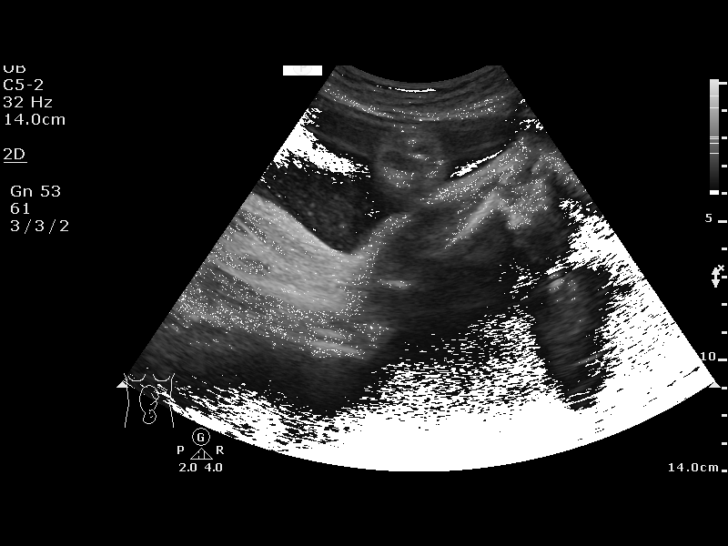
[im 5/14]
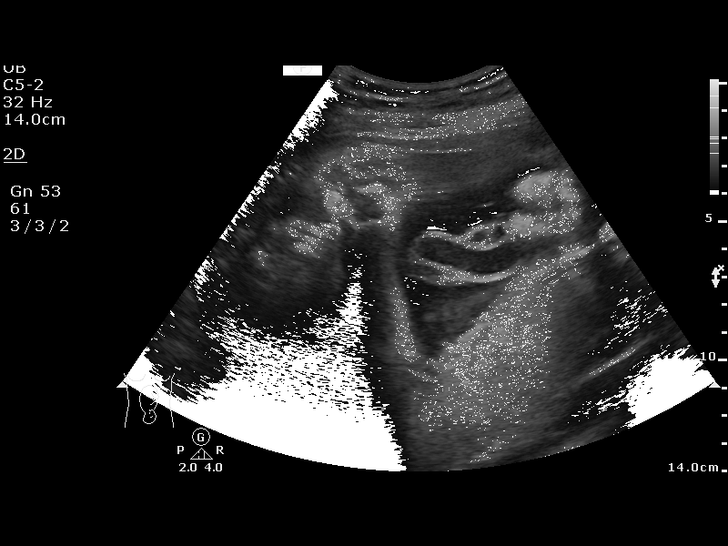
[im 6/14]
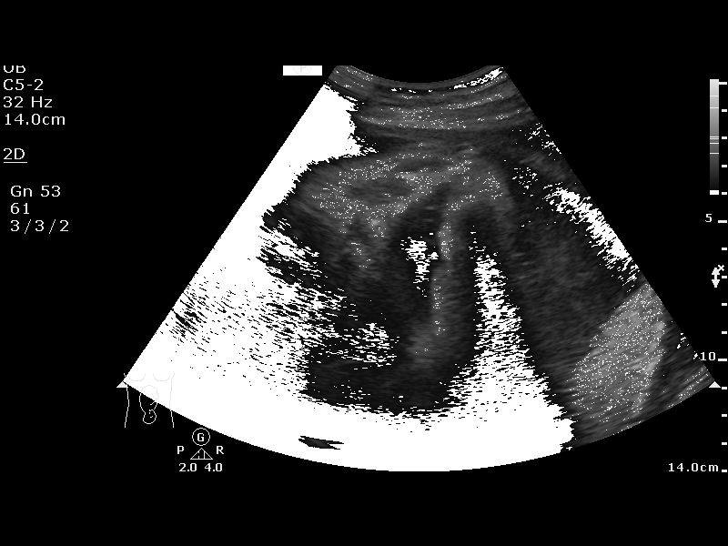
[im 8/14]
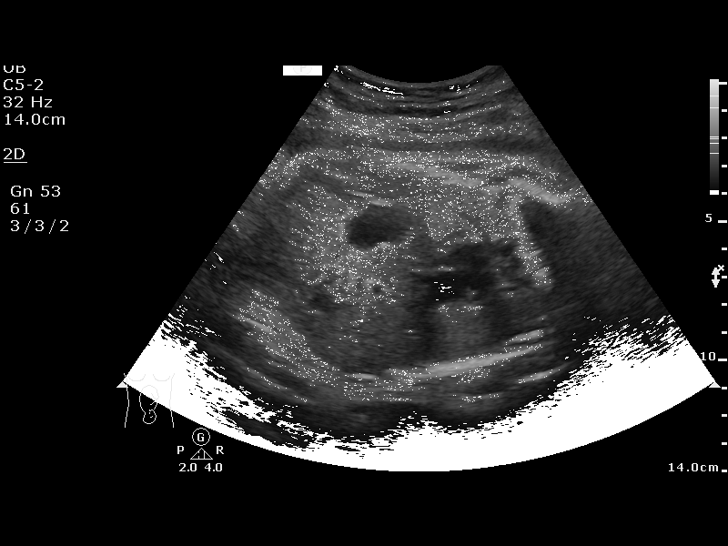
[im 9/14]
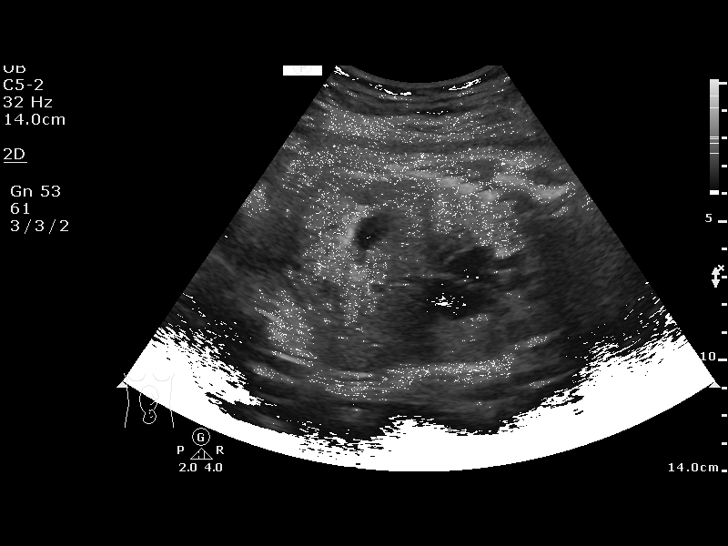
[im 10/14]
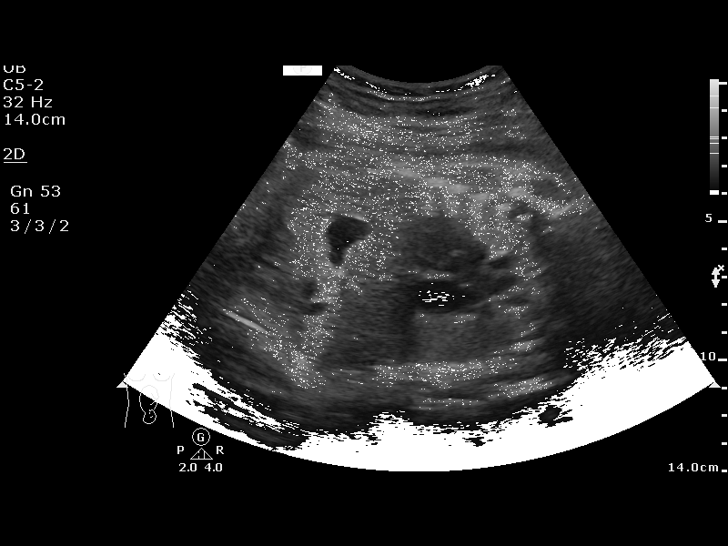
[im 11/14]
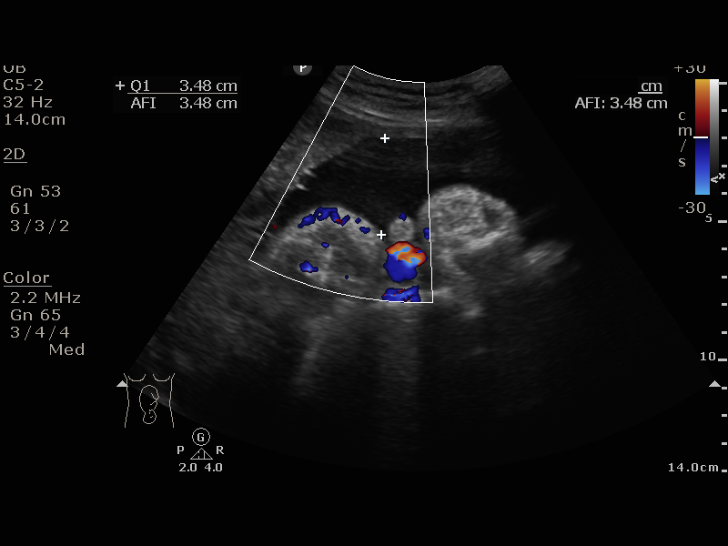
[im 12/14]
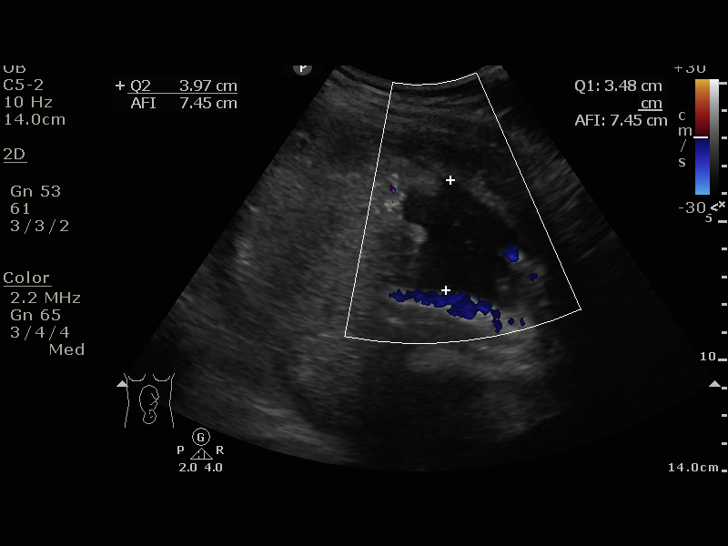
[im 13/14]
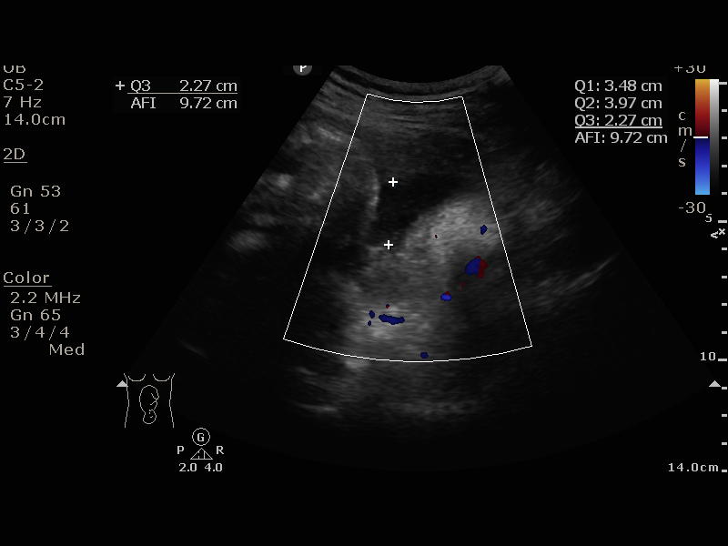
[im 14/14]
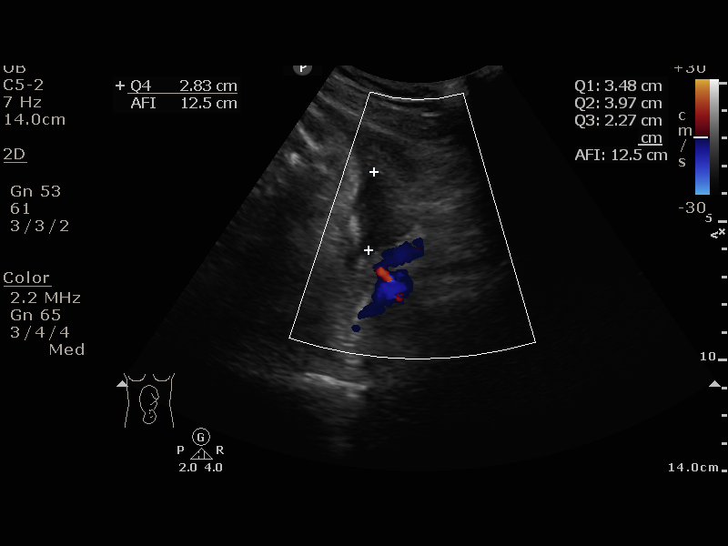

[13 of 14 positions shown; findings below may reference images not displayed]

[REDACTED]care Nya
ABQOD NP

1  US FETAL BPP W/NONSTRESS                    76818.4

1  AYAKO HYNES          [id number]      [id number]     [id number]
Service(s) Provided

Indications

32 weeks gestation of pregnancy
Unspecified pre-existing hypertension
complicating pregnancy, third trimester
OB History

Blood Type:            Height:  5'4"   Weight (lb):  162       BMI:
Gravidity:    2         Term:   0         SAB:   1
Living:       0
Fetal Evaluation

Num Of Fetuses:     1
Preg. Location:     Intrauterine
Cardiac Activity:   Observed
Presentation:       Cephalic

Amniotic Fluid
AFI FV:      Subjectively within normal limits

AFI Sum(cm)     %Tile       Largest Pocket(cm)
12.55           37

RUQ(cm)       RLQ(cm)       LUQ(cm)        LLQ(cm)
3.48
Biophysical Evaluation

Amniotic F.V:   Pocket => 2 cm two         F. Tone:        Observed
planes
F. Movement:    Observed                   N.S.T:          Reactive
F. Breathing:   Observed                   Score:          [DATE]
Gestational Age

LMP:           32w 6d        Date:  04/21/17                 EDD:   01/26/18
Best:          32w 6d     Det. By:  LMP  (04/21/17)          EDD:   01/26/18
Impression

Reassuring AP testing
Recommendations

Continue with testing as clinically indicated

## 2019-09-21 IMAGING — US US FETAL BPP W/ NON-STRESS
1 series · 13 of 13 positions shown · non-contrast
Comparison: none

[Series 1: us fetal bpp w/nonstress · 13 acquisitions, 13 frames shown]
[im 1/13]
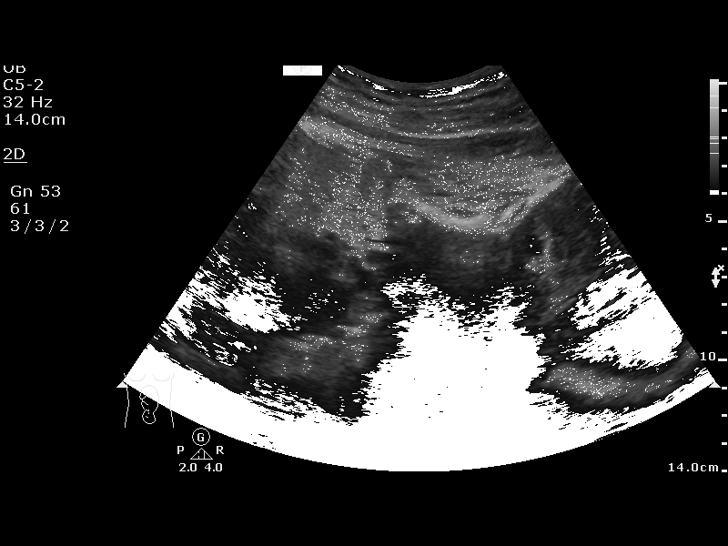
[im 2/13]
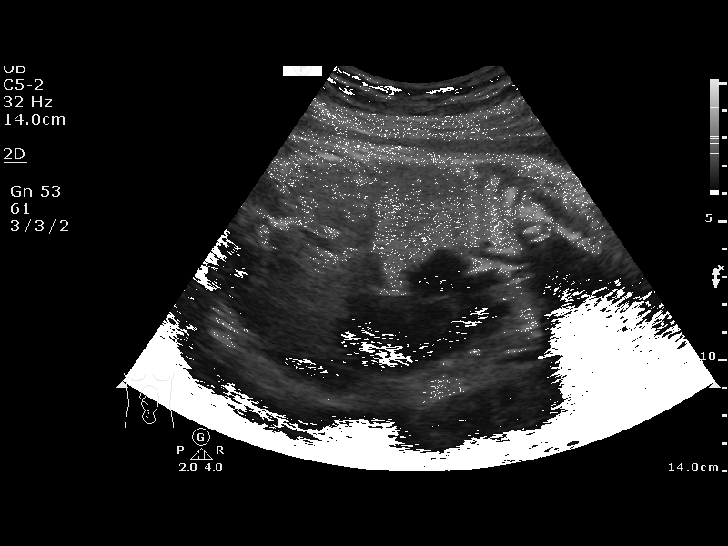
[im 3/13]
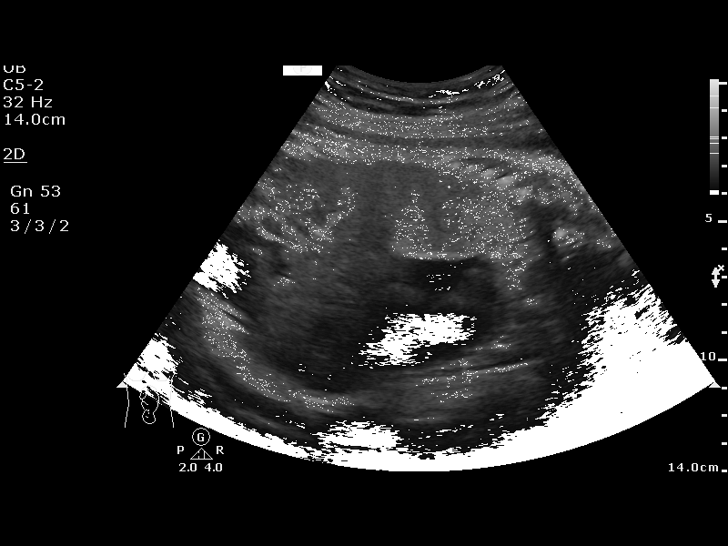
[im 4/13]
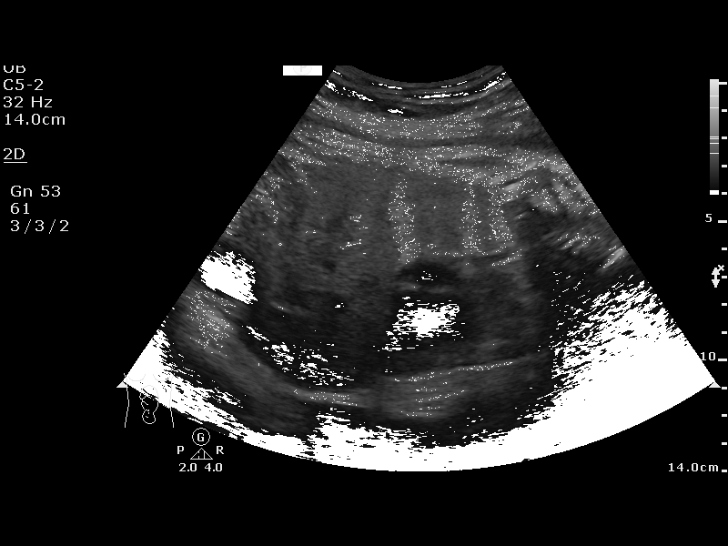
[im 5/13]
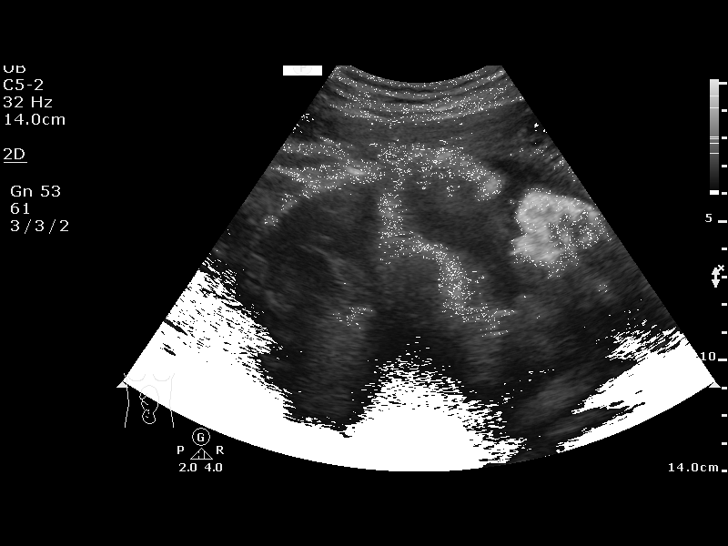
[im 6/13]
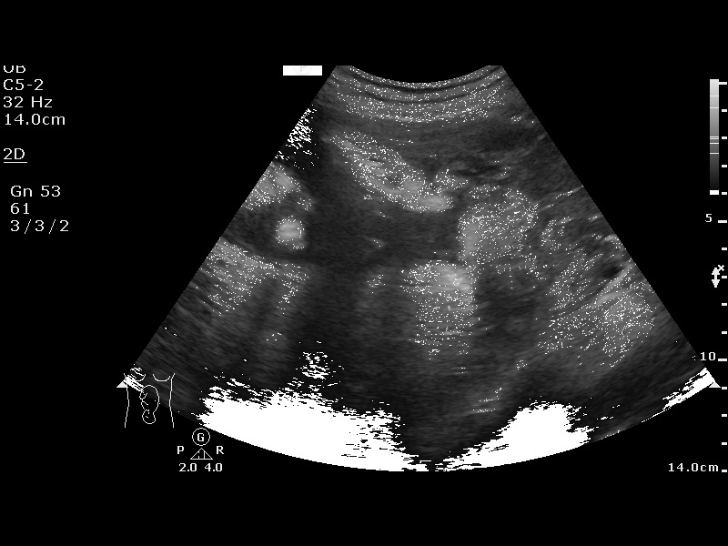
[im 7/13]
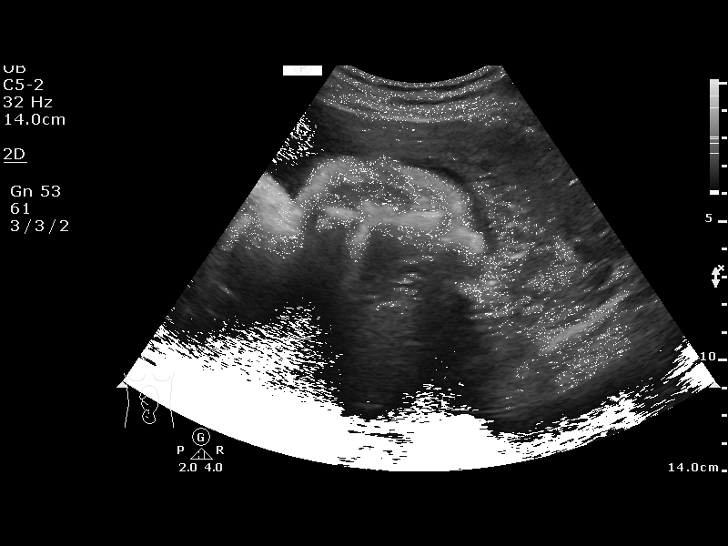
[im 8/13]
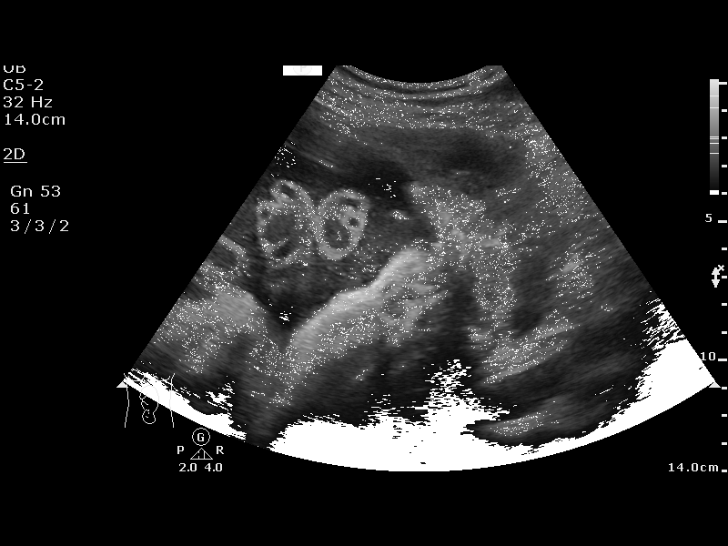
[im 9/13]
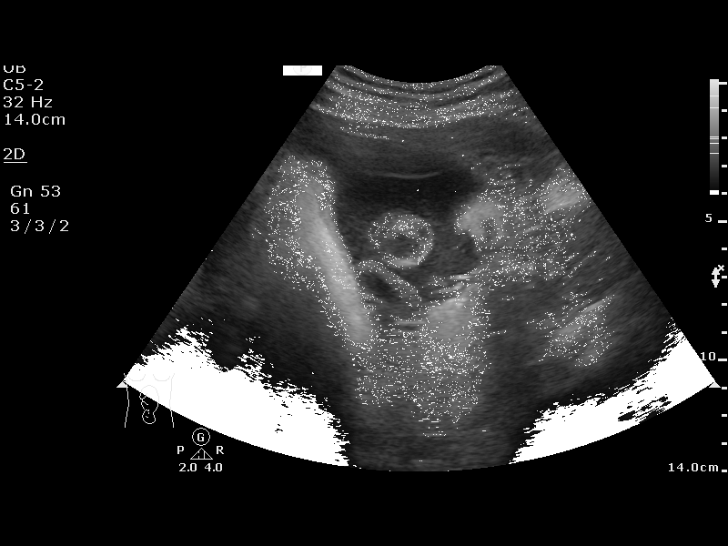
[im 10/13]
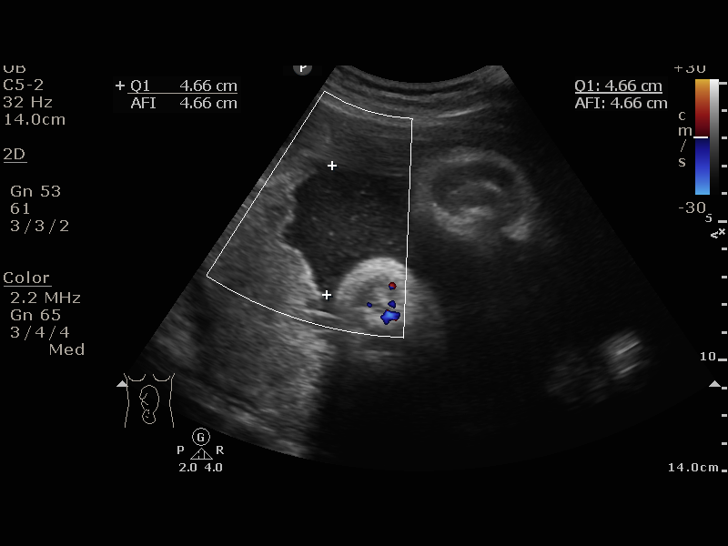
[im 11/13]
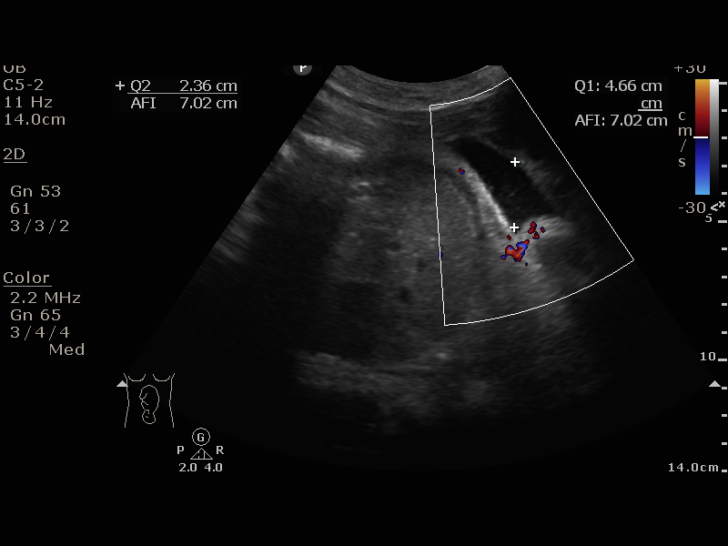
[im 12/13]
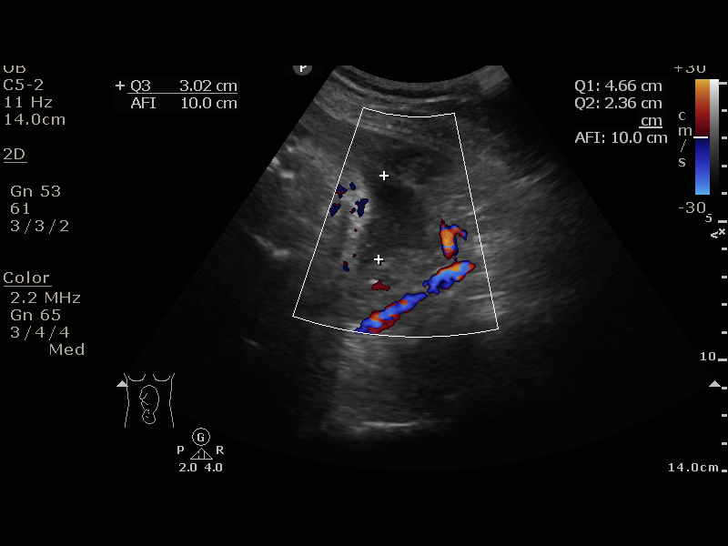
[im 13/13]
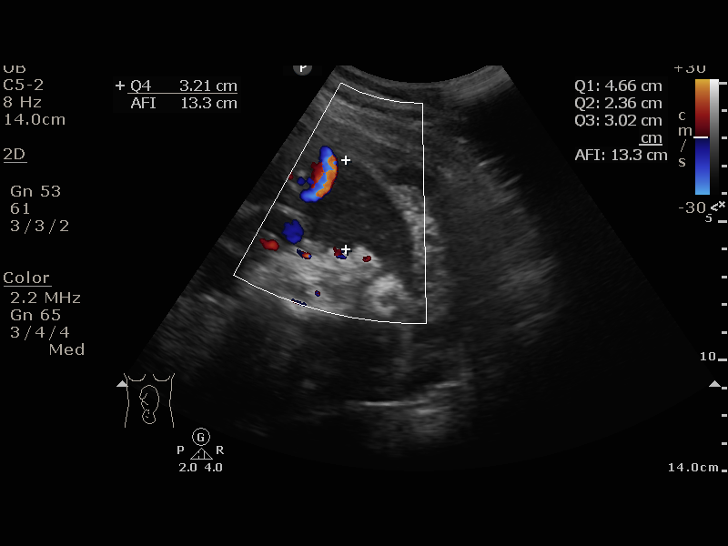

[13 of 13 positions shown; findings below may reference images not displayed]

Attending:        Ehsan Mio         [facility]
STU NP

1  US FETAL BPP W/NONSTRESS                    76818.4

1  JASSEN JH           [id number]      [id number]     [id number]
Service(s) Provided

Indications

33 weeks gestation of pregnancy
Unspecified pre-existing hypertension
complicating pregnancy, third trimester
OB History

Blood Type:            Height:  5'4"   Weight (lb):  162       BMI:
Gravidity:    2         Term:   0         SAB:   1
Living:       0
Fetal Evaluation

Num Of Fetuses:     1
Preg. Location:     Intrauterine
Cardiac Activity:   Observed
Presentation:       Cephalic

Amniotic Fluid
AFI FV:      Subjectively within normal limits

AFI Sum(cm)     %Tile       Largest Pocket(cm)
13.25           43

RUQ(cm)       RLQ(cm)       LUQ(cm)        LLQ(cm)
4.66
Biophysical Evaluation

Amniotic F.V:   Pocket => 2 cm two         F. Tone:        Observed
planes
F. Movement:    Observed                   N.S.T:          Reactive
F. Breathing:   Observed                   Score:          [DATE]
Gestational Age

LMP:           33w 6d        Date:  04/21/17                 EDD:   01/26/18
Best:          33w 6d     Det. By:  LMP  (04/21/17)          EDD:   01/26/18
Impression

IUP at  55w1d
Normal amniotic fluid volume
Recommendations

Continue recommended antenatal testing

## 2021-10-13 ENCOUNTER — Other Ambulatory Visit: Payer: Self-pay

## 2021-10-13 ENCOUNTER — Encounter: Payer: Self-pay | Admitting: Emergency Medicine

## 2021-10-13 ENCOUNTER — Ambulatory Visit
Admission: EM | Admit: 2021-10-13 | Discharge: 2021-10-13 | Disposition: A | Discharge status: Home / Self Care | Payer: Medicaid Other | Attending: Family Medicine | Admitting: Family Medicine

## 2021-10-13 DIAGNOSIS — M25511 Pain in right shoulder: Secondary | ICD-10-CM

## 2021-10-13 DIAGNOSIS — S61411A Laceration without foreign body of right hand, initial encounter: Secondary | ICD-10-CM

## 2021-10-13 DIAGNOSIS — G8929 Other chronic pain: Secondary | ICD-10-CM

## 2021-10-13 MED ORDER — MUPIROCIN 2 % EX OINT: 1.0000 "application " | TOPICAL_OINTMENT | Freq: Two times a day (BID) | CUTANEOUS | 0 refills | Status: DC

## 2021-10-13 MED ORDER — NAPROXEN 500 MG PO TABS: 500.0000 mg | ORAL_TABLET | Freq: Two times a day (BID) | ORAL | 0 refills | Status: DC | PRN

## 2021-10-13 MED ORDER — CYCLOBENZAPRINE HCL 5 MG PO TABS: 5.0000 mg | ORAL_TABLET | Freq: Three times a day (TID) | ORAL | 0 refills | Status: DC | PRN

## 2021-10-13 MED ORDER — HIBICLENS 4 % EX LIQD: Freq: Every day | CUTANEOUS | 0 refills | Status: DC | PRN
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
## 2021-11-25 ENCOUNTER — Encounter: Payer: Self-pay | Admitting: Emergency Medicine

## 2021-11-25 ENCOUNTER — Ambulatory Visit
Admission: EM | Admit: 2021-11-25 | Discharge: 2021-11-25 | Disposition: A | Discharge status: Home / Self Care | Payer: PRIVATE HEALTH INSURANCE | Attending: Family Medicine | Admitting: Family Medicine

## 2021-11-25 ENCOUNTER — Other Ambulatory Visit: Payer: Self-pay

## 2021-11-25 DIAGNOSIS — M25511 Pain in right shoulder: Secondary | ICD-10-CM | POA: Diagnosis present

## 2021-11-25 DIAGNOSIS — Z113 Encounter for screening for infections with a predominantly sexual mode of transmission: Secondary | ICD-10-CM | POA: Diagnosis not present

## 2021-11-25 DIAGNOSIS — G8929 Other chronic pain: Secondary | ICD-10-CM | POA: Diagnosis present

## 2021-11-25 DIAGNOSIS — L738 Other specified follicular disorders: Secondary | ICD-10-CM | POA: Diagnosis present

## 2021-11-25 LAB — POCT URINALYSIS DIP (MANUAL ENTRY)
Bilirubin, UA: NEGATIVE
Glucose, UA: NEGATIVE mg/dL
Nitrite, UA: NEGATIVE
Protein Ur, POC: NEGATIVE mg/dL
Spec Grav, UA: 1.015 (ref 1.010–1.025)
Urobilinogen, UA: 0.2 E.U./dL
pH, UA: 6.5 (ref 5.0–8.0)

## 2021-11-25 MED ORDER — NAPROXEN 500 MG PO TABS: 500.0000 mg | ORAL_TABLET | Freq: Two times a day (BID) | ORAL | 0 refills | Status: AC | PRN

## 2021-11-25 MED ORDER — CYCLOBENZAPRINE HCL 5 MG PO TABS: 5.0000 mg | ORAL_TABLET | Freq: Three times a day (TID) | ORAL | 0 refills | Status: AC | PRN

## 2021-11-25 MED ORDER — DOXYCYCLINE HYCLATE 100 MG PO CAPS: 100.0000 mg | ORAL_CAPSULE | Freq: Two times a day (BID) | ORAL | 0 refills | Status: AC

## 2021-11-25 NOTE — Discharge Instructions (Addendum)
We have sent testing for sexually transmitted infections. We will notify you of any positive results once they are received. If required, we will prescribe any medications you might need.  Please refrain from all sexual activity for at least the next seven days.  
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
## 2021-11-26 LAB — CERVICOVAGINAL ANCILLARY ONLY
Bacterial Vaginitis (gardnerella): NEGATIVE
Candida Glabrata: NEGATIVE
Candida Vaginitis: NEGATIVE
Chlamydia: NEGATIVE
Comment: NEGATIVE
Comment: NEGATIVE
Comment: NEGATIVE
Comment: NEGATIVE
Comment: NEGATIVE
Comment: NORMAL
Neisseria Gonorrhea: NEGATIVE
Trichomonas: POSITIVE — AB

## 2021-11-27 ENCOUNTER — Telehealth (HOSPITAL_COMMUNITY): Payer: Self-pay | Admitting: Emergency Medicine

## 2021-11-27 MED ORDER — METRONIDAZOLE 500 MG PO TABS: 500.0000 mg | ORAL_TABLET | Freq: Two times a day (BID) | ORAL | 0 refills | Status: DC

## 2021-11-27 MED ORDER — METRONIDAZOLE 500 MG PO TABS: 500.0000 mg | ORAL_TABLET | Freq: Two times a day (BID) | ORAL | 0 refills | Status: AC
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
## 2022-01-02 ENCOUNTER — Emergency Department (HOSPITAL_COMMUNITY)
Admission: EM | Admit: 2022-01-02 | Discharge: 2022-01-02 | Discharge status: Against Medical Advice | Payer: PRIVATE HEALTH INSURANCE
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
